# Patient Record
Sex: Female | Born: 1995 | Race: White | Hispanic: No | Marital: Single | Smoking: Never smoker
Health system: Southern US, Community
[De-identification: ages and names within clinical notes are randomized; demographics above are authoritative.]

## PROBLEM LIST (undated history)

## (undated) DIAGNOSIS — M810 Age-related osteoporosis without current pathological fracture: Secondary | ICD-10-CM

## (undated) DIAGNOSIS — F509 Eating disorder, unspecified: Secondary | ICD-10-CM

## (undated) HISTORY — DX: Eating disorder, unspecified: F50.9

## (undated) HISTORY — PX: NASAL SINUS SURGERY: SHX719

## (undated) HISTORY — DX: Age-related osteoporosis without current pathological fracture: M81.0

---

## 2017-05-02 DIAGNOSIS — M810 Age-related osteoporosis without current pathological fracture: Secondary | ICD-10-CM

## 2017-05-02 HISTORY — DX: Age-related osteoporosis without current pathological fracture: M81.0

## 2017-06-17 ENCOUNTER — Other Ambulatory Visit: Payer: Self-pay

## 2017-06-17 ENCOUNTER — Encounter: Payer: Self-pay | Admitting: Physician Assistant

## 2017-06-17 ENCOUNTER — Ambulatory Visit (INDEPENDENT_AMBULATORY_CARE_PROVIDER_SITE_OTHER): Payer: BLUE CROSS/BLUE SHIELD | Admitting: Physician Assistant

## 2017-06-17 VITALS — BP 96/68 | HR 80 | Temp 99.0°F | Resp 18 | Ht 65.0 in | Wt 94.4 lb

## 2017-06-17 DIAGNOSIS — M818 Other osteoporosis without current pathological fracture: Secondary | ICD-10-CM | POA: Diagnosis not present

## 2017-06-17 DIAGNOSIS — Z114 Encounter for screening for human immunodeficiency virus [HIV]: Secondary | ICD-10-CM

## 2017-06-17 DIAGNOSIS — Z113 Encounter for screening for infections with a predominantly sexual mode of transmission: Secondary | ICD-10-CM

## 2017-06-17 DIAGNOSIS — Z7689 Persons encountering health services in other specified circumstances: Secondary | ICD-10-CM | POA: Diagnosis not present

## 2017-06-17 DIAGNOSIS — M810 Age-related osteoporosis without current pathological fracture: Secondary | ICD-10-CM | POA: Insufficient documentation

## 2017-06-17 DIAGNOSIS — Z13220 Encounter for screening for lipoid disorders: Secondary | ICD-10-CM | POA: Diagnosis not present

## 2017-06-17 DIAGNOSIS — F509 Eating disorder, unspecified: Secondary | ICD-10-CM | POA: Diagnosis not present

## 2017-06-17 NOTE — Progress Notes (Signed)
Patient ID: Gail Anderson, female    DOB: [birth year], 22 y.o.   MRN: [id number]  PCP: Gail Anderson, Gail Sartin, PA-C  Chief Complaint  Patient presents with  . Establish Care  . Eating Disorder    Subjective:   Presents to establish care and for evaluation of medical stability for treatment of disordered eating with a nutritionist and therapist.  Initial diagnosis at age 22.  Describes herself as an overweight kid, and lost weight with health yeating changes and exercise, but then lost control. Relapsed 07/2016, worsened at home over the summer break from school. Thinks this was due to going to college, joining a sorority, stressors of school and partying/alcohol consumption leading to weight gain.  Restriction, primarily. Safe foods, restricting timing of eating. "As little as possible to get through the day." No induced emesis, no laxatives. Not currently, but has previously over-exercised. No known family members with disordered eating. No previous medications, never been brought up.  Family is supportive, though emotions are high at home. Her parents are better able to manage her disorder, "the second time around."  No CP or SOB, palpitations recently, has in the past. Sometimes dizziness. Fatigue, but improved. Feels anxious, not depressed.  Started COC the summer before college, in anticipation of sexual activity. Was advised to start calcium supplementation following DEXA revealing osteoporosis 05/2017, but doesn't remember to take it.    Patient Active Problem List   Diagnosis Date Noted  . Eating disorder 06/17/2017  . Osteoporosis 06/17/2017    Past Medical History:  Diagnosis Date  . Eating disorder   . Osteoporosis 05/2017     Prior to Admission medications   Medication Sig Start Date End Date Taking? Authorizing Provider  CRYSELLE-28 0.3-30 MG-MCG tablet  05/27/17   [provider]    Allergies  Allergen Reactions  . Penicillins Rash    Past  Surgical History:  Procedure Laterality Date  . NASAL SINUS SURGERY      Family History  Problem Relation Age of Onset  . Hyperlipidemia Mother   . Heart disease Paternal Grandfather     Social History   Socioeconomic History  . Marital status: Single    Spouse name: None  . Number of children: 0  . Years of education: None  . Highest education level: None  Social Needs  . Financial resource strain: Not hard at all  . Food insecurity - worry: Never true  . Food insecurity - inability: Never true  . Transportation needs - medical: No  . Transportation needs - non-medical: No  Occupational History  . Occupation: Consulting civil engineerstudent    Comment: [employer] ([employer])  Tobacco Use  . Smoking status: Never Smoker  . Smokeless tobacco: Never Used  Substance and Sexual Activity  . Alcohol use: No    Frequency: Never  . Drug use: No  . Sexual activity: Yes    Birth control/protection: Pill  Other Topics Concern  . None  Social History Narrative   From [place] school in [place], [place]   Lives in off-campus apartment with a roommate.       Review of Systems  Depression screen [facility] 2/9 06/17/2017  Decreased Interest 0  Down, Depressed, Hopeless 0  PHQ - 2 Score 0       Objective:  Physical Exam  Constitutional: She is oriented to person, place, and time. She appears well-developed and well-nourished. She is active and cooperative. No distress.  BP 96/68 (BP Location: Left  Arm, Patient Position: Sitting, Cuff Size: Small)   Pulse 80   Temp 99 F (37.2 C) (Oral)   Resp 18   Ht 5\' 5"  (1.651 m)   Wt 94 lb 6.4 oz (42.8 kg)   LMP 05/27/2017   SpO2 98%   BMI 15.71 kg/m   HENT:  Head: Normocephalic and atraumatic.  Right Ear: Hearing normal.  Left Ear: Hearing normal.  Eyes: Conjunctivae are normal. No scleral icterus.  Neck: Normal range of motion. Neck supple. No thyromegaly present.  Cardiovascular: Normal rate, regular rhythm and normal heart  sounds.  Pulses:      Radial pulses are 2+ on the right side, and 2+ on the left side.  Pulmonary/Chest: Effort normal and breath sounds normal.  Abdominal: Soft. Bowel sounds are normal. She exhibits no distension and no mass. There is no tenderness.  Musculoskeletal: She exhibits no edema or deformity.  Lymphadenopathy:       Head (right side): No tonsillar, no preauricular, no posterior auricular and no occipital adenopathy present.       Head (left side): No tonsillar, no preauricular, no posterior auricular and no occipital adenopathy present.    She has no cervical adenopathy.       Right: No supraclavicular adenopathy present.       Left: No supraclavicular adenopathy present.  Neurological: She is alert and oriented to person, place, and time. No sensory deficit.  Skin: Skin is warm, dry and intact. No rash noted. No cyanosis or erythema. Nails show no clubbing.  Psychiatric: She has a normal mood and affect. Her speech is normal and behavior is normal. Judgment and thought content normal. Cognition and memory are normal.       Orthostatic VS for the past 24 hrs:  BP- Lying Pulse- Lying BP- Sitting Pulse- Sitting BP- Standing at 0 minutes Pulse- Standing at 0 minutes  06/17/17 0934 99/66 57 101/68 66 108/74 74    EKG reviewed with Dr. Creta Levin. Sinus bradycardia, rate of 58. PR 156. QT 406. No previous tracing for comparison.    Assessment & Plan:   Problem List Items Addressed This Visit    Eating disorder    Relapse. Continue nutrition counseling, establish with therapist, continue COC. Await lab results. RESUME calcium supplementation.      Relevant Orders   CBC with Differential/Platelet   TSH   Urinalysis, dipstick only   Amylase   EKG 12-Lead (Completed)   Ferritin   Lipase   Magnesium   Phosphorus   T3, free   T4   ToxASSURE Select 13 (MW), Urine   Vitamin B12   Orthostatic vital signs   Hepatic function panel   Basic metabolic panel   Acute Hep  Panel & Hep B Surface Ab   VITAMIN D 25 Hydroxy (Vit-D Deficiency, Fractures)   Osteoporosis    Resume calcium supplement. Repeat DEXA 05/2019.       Other Visit Diagnoses    Encounter to establish care    -  Primary   Screening for HIV (human immunodeficiency virus)       Relevant Orders   HIV antibody   Screening for hyperlipidemia       Relevant Orders   Lipid panel   Routine screening for STI (sexually transmitted infection)       Relevant Orders   GC/Chlamydia Probe Amp       Return in about 3 months (around 09/15/2017) for re-evalaution of disordered eating.   Fernande Bras, PA-C  [facility] at [facility]

## 2017-06-17 NOTE — Patient Instructions (Addendum)
Please resume the calcium supplementation. Put it with your contraceptive pill to help you remember to take it.   IF you received an x-ray today, you will receive an invoice from [facility]. Please contact [facility] at [phone] with questions or concerns regarding your invoice.   IF you received labwork today, you will receive an invoice from [facility]. Please contact [facility] at [phone] with questions or concerns regarding your invoice.   Our billing staff will not be able to assist you with questions regarding bills from these companies.  You will be contacted with the lab results as soon as they are available. The fastest way to get your results is to activate your My Chart account. Instructions are located on the last page of this paperwork. If you have not heard from us regarding the results in 2 weeks, please contact this office.

## 2017-06-17 NOTE — Assessment & Plan Note (Signed)
Relapse. Continue nutrition counseling, establish with therapist, continue COC. Await lab results. RESUME calcium supplementation.

## 2017-06-17 NOTE — Assessment & Plan Note (Signed)
Resume calcium supplement. Repeat DEXA 05/2019.

## 2017-06-18 LAB — CBC WITH DIFFERENTIAL/PLATELET
Basophils Absolute: 0.1 10*3/uL (ref 0.0–0.2)
Basos: 1 %
EOS (ABSOLUTE): 0.3 10*3/uL (ref 0.0–0.4)
EOS: 4 %
HEMATOCRIT: 39.7 % (ref 34.0–46.6)
Hemoglobin: 13.4 g/dL (ref 11.1–15.9)
Immature Grans (Abs): 0 10*3/uL (ref 0.0–0.1)
Immature Granulocytes: 0 %
LYMPHS ABS: 2.4 10*3/uL (ref 0.7–3.1)
Lymphs: 37 %
MCH: 32 pg (ref 26.6–33.0)
MCHC: 33.8 g/dL (ref 31.5–35.7)
MCV: 95 fL (ref 79–97)
MONOS ABS: 0.4 10*3/uL (ref 0.1–0.9)
Monocytes: 6 %
Neutrophils Absolute: 3.3 10*3/uL (ref 1.4–7.0)
Neutrophils: 52 %
PLATELETS: 306 10*3/uL (ref 150–379)
RBC: 4.19 x10E6/uL (ref 3.77–5.28)
RDW: 13.6 % (ref 12.3–15.4)
WBC: 6.4 10*3/uL (ref 3.4–10.8)

## 2017-06-18 LAB — BASIC METABOLIC PANEL
BUN/Creatinine Ratio: 10 (ref 9–23)
BUN: 9 mg/dL (ref 6–20)
CO2: 23 mmol/L (ref 20–29)
CREATININE: 0.86 mg/dL (ref 0.57–1.00)
Calcium: 9.8 mg/dL (ref 8.7–10.2)
Chloride: 100 mmol/L (ref 96–106)
GFR calc Af Amer: 113 mL/min/{1.73_m2} (ref >59)
GFR, EST NON AFRICAN AMERICAN: 98 mL/min/{1.73_m2} (ref >59)
Glucose: 75 mg/dL (ref 65–99)
Potassium: 3.8 mmol/L (ref 3.5–5.2)
SODIUM: 141 mmol/L (ref 134–144)

## 2017-06-18 LAB — VITAMIN D 25 HYDROXY (VIT D DEFICIENCY, FRACTURES): Vit D, 25-Hydroxy: 35.9 ng/mL (ref 30.0–100.0)

## 2017-06-18 LAB — LIPID PANEL
CHOL/HDL RATIO: 3.2 ratio (ref 0.0–4.4)
CHOLESTEROL TOTAL: 205 mg/dL — AB (ref 100–169)
HDL: 65 mg/dL (ref >39)
LDL CALC: 113 mg/dL — AB (ref 0–109)
Triglycerides: 136 mg/dL — ABNORMAL HIGH (ref 0–89)
VLDL Cholesterol Cal: 27 mg/dL (ref 5–40)

## 2017-06-18 LAB — MAGNESIUM: Magnesium: 2.3 mg/dL (ref 1.6–2.3)

## 2017-06-18 LAB — HIV ANTIBODY (ROUTINE TESTING W REFLEX): HIV Screen 4th Generation wRfx: NONREACTIVE

## 2017-06-18 LAB — URINALYSIS, DIPSTICK ONLY
Bilirubin, UA: NEGATIVE
GLUCOSE, UA: NEGATIVE
Ketones, UA: NEGATIVE
Leukocytes, UA: NEGATIVE
Nitrite, UA: NEGATIVE
PROTEIN UA: NEGATIVE
RBC, UA: NEGATIVE
Specific Gravity, UA: 1.009 (ref 1.005–1.030)
Urobilinogen, Ur: 0.2 mg/dL (ref 0.2–1.0)
pH, UA: 6.5 (ref 5.0–7.5)

## 2017-06-18 LAB — LIPASE: Lipase: 33 U/L (ref 14–72)

## 2017-06-18 LAB — ACUTE HEP PANEL AND HEP B SURFACE AB
HEP B S AG: NEGATIVE
Hep A IgM: NEGATIVE
Hep B C IgM: NEGATIVE
Hepatitis B Surf Ab Quant: <3.1 m[IU]/mL — ABNORMAL LOW (ref >9.9)

## 2017-06-18 LAB — HEPATIC FUNCTION PANEL
ALK PHOS: 39 IU/L (ref 39–117)
ALT: 35 IU/L — ABNORMAL HIGH (ref 0–32)
AST: 27 IU/L (ref 0–40)
Albumin: 4.6 g/dL (ref 3.5–5.5)
BILIRUBIN, DIRECT: 0.11 mg/dL (ref 0.00–0.40)
Bilirubin Total: 0.4 mg/dL (ref 0.0–1.2)
TOTAL PROTEIN: 7.4 g/dL (ref 6.0–8.5)

## 2017-06-18 LAB — AMYLASE: AMYLASE: 105 U/L (ref 31–124)

## 2017-06-18 LAB — FERRITIN: FERRITIN: 28 ng/mL (ref 15–77)

## 2017-06-18 LAB — GC/CHLAMYDIA PROBE AMP
CHLAMYDIA, DNA PROBE: NEGATIVE
Neisseria gonorrhoeae by PCR: NEGATIVE

## 2017-06-18 LAB — T3, FREE: T3 FREE: 2.3 pg/mL (ref 2.3–5.0)

## 2017-06-18 LAB — T4: T4, Total: 8.6 ug/dL (ref 4.5–12.0)

## 2017-06-18 LAB — VITAMIN B12: Vitamin B-12: 942 pg/mL (ref 232–1245)

## 2017-06-18 LAB — TSH: TSH: 2.37 u[IU]/mL (ref 0.450–4.500)

## 2017-06-18 LAB — PHOSPHORUS: Phosphorus: 3.6 mg/dL (ref 2.5–5.3)

## 2017-06-23 LAB — TOXASSURE SELECT 13 (MW), URINE

## 2017-06-24 ENCOUNTER — Telehealth: Payer: Self-pay | Admitting: Physician Assistant

## 2017-06-24 NOTE — Telephone Encounter (Signed)
Copied from CRM [phone]. Topic: General - Other >> Jun 24, 2017  9:43 AM [name], [name], RMA wrote: Reason for CRM: Rosezena SensorMegan Haley from [employer] called and would like a call back concerning recent lab work Please contact her at [phone]

## 2017-06-24 NOTE — Telephone Encounter (Signed)
Can you review labs so I can inform patient and get the okay from the patient to release results to Rosezena SensorMegan Haley from [employer].

## 2017-06-24 NOTE — Telephone Encounter (Signed)
Gail not on DPR.   Copied from CRM [phone]. Topic: General - Other >> Jun 24, 2017  9:43 AM [name], [name], RMA wrote: Reason for CRM: [name] from [employer] called and would like a call back concerning recent lab work Please contact her at [phone]

## 2017-06-25 NOTE — Telephone Encounter (Signed)
Results released to the patient in my chart.  OK to release to [facility].

## 2017-07-31 ENCOUNTER — Ambulatory Visit: Payer: Self-pay | Admitting: Physician Assistant

## 2017-08-01 ENCOUNTER — Other Ambulatory Visit: Payer: Self-pay

## 2017-08-01 ENCOUNTER — Ambulatory Visit: Payer: BLUE CROSS/BLUE SHIELD | Admitting: Physician Assistant

## 2017-08-01 ENCOUNTER — Encounter: Payer: Self-pay | Admitting: Physician Assistant

## 2017-08-01 VITALS — BP 101/68 | HR 70 | Temp 98.0°F | Ht 65.0 in | Wt 94.8 lb

## 2017-08-01 DIAGNOSIS — Z113 Encounter for screening for infections with a predominantly sexual mode of transmission: Secondary | ICD-10-CM | POA: Diagnosis not present

## 2017-08-01 DIAGNOSIS — F419 Anxiety disorder, unspecified: Secondary | ICD-10-CM

## 2017-08-01 DIAGNOSIS — F509 Eating disorder, unspecified: Secondary | ICD-10-CM

## 2017-08-01 DIAGNOSIS — F411 Generalized anxiety disorder: Secondary | ICD-10-CM | POA: Insufficient documentation

## 2017-08-01 LAB — POCT CBC
GRANULOCYTE PERCENT: 64 % (ref 37–80)
HEMATOCRIT: 41.4 % (ref 37.7–47.9)
Hemoglobin: 13.9 g/dL (ref 12.2–16.2)
LYMPH, POC: 2.4 (ref 0.6–3.4)
MCH, POC: 28.2 pg (ref 27–31.2)
MCHC: 33.6 g/dL (ref 31.8–35.4)
MCV: 83.8 fL (ref 80–97)
MID (CBC): 0.3 (ref 0–0.9)
MPV: 7 fL (ref 0–99.8)
PLATELET COUNT, POC: 329 10*3/uL (ref 142–424)
POC Granulocyte: 4.7 (ref 2–6.9)
POC LYMPH %: 32.5 % (ref 10–50)
POC MID %: 3.5 %M (ref 0–12)
RBC: 4.94 M/uL (ref 4.04–5.48)
RDW, POC: 12.7 %
WBC: 7.4 10*3/uL (ref 4.6–10.2)

## 2017-08-01 LAB — POCT URINE PREGNANCY: Preg Test, Ur: NEGATIVE

## 2017-08-01 MED ORDER — SERTRALINE HCL 50 MG PO TABS: 50.0000 mg | ORAL_TABLET | Freq: Every day | ORAL | 3 refills | Status: DC

## 2017-08-01 NOTE — Assessment & Plan Note (Signed)
Start sertraline 50 mg.  1/2 tablet daily for 1 week then increase to 1 tablet.  Counseled regarding potential side effects.  Anticipatory guidance provided.

## 2017-08-01 NOTE — Patient Instructions (Addendum)
When starting the sertraline (Zoloft), take 1/2 tablet each day for the first week, then increase to the whole tablet.    IF you received an x-ray today, you will receive an invoice from [facility]. Please contact [facility] at [phone] with questions or concerns regarding your invoice.   IF you received labwork today, you will receive an invoice from [facility]. Please contact [facility] at [phone] with questions or concerns regarding your invoice.   Our billing staff will not be able to assist you with questions regarding bills from these companies.  You will be contacted with the lab results as soon as they are available. The fastest way to get your results is to activate your My Chart account. Instructions are located on the last page of this paperwork. If you have not heard from us regarding the results in 2 weeks, please contact this office.

## 2017-08-01 NOTE — Progress Notes (Signed)
Patient ID: Gail Anderson, female    DOB: 1996-02-10, 22 y.o.   MRN: [id number]  PCP: Porfirio Oar, PA-C  Chief Complaint  Patient presents with  . Follow-up    follow up on eating disorder and Dietician is requesting labwork    Subjective:   Presents for evaluation of Disordered eating.  Her dietician advised her to come in to update labs, EKG and orthostatics, wanting to have more frequent medical visits due to active eating disorder.  Both her dietitian and her therapist recommend that she start medication for anxiety. No previous medical treatment for mood disorder. She denies induced emesis, laxative use, over exercise. She denies chest pain, SOB, dizziness.   Review of Systems  Constitutional: Positive for fatigue. Negative for activity change, appetite change and unexpected weight change.  HENT: Negative for sore throat.   Eyes: Negative for visual disturbance.  Respiratory: Negative for cough, chest tightness, shortness of breath and wheezing.   Cardiovascular: Negative for chest pain and palpitations.  Gastrointestinal: Positive for constipation. Negative for abdominal pain, diarrhea, nausea and vomiting.  Genitourinary: Negative for dysuria, frequency, hematuria and urgency.  Musculoskeletal: Negative for arthralgias and myalgias.  Skin: Negative for rash.  Neurological: Negative for dizziness, weakness and headaches.  Psychiatric/Behavioral: Negative for decreased concentration. The patient is nervous/anxious.      Depression screen [facility] 2/9 08/01/2017 06/17/2017  Decreased Interest 0 0  Down, Depressed, Hopeless 0 0  PHQ - 2 Score 0 0    GAD 7 : Generalized Anxiety Score 08/01/2017  Nervous, Anxious, on Edge 2  Control/stop worrying 2  Worry too much - different things 3  Trouble relaxing 3  Restless 2  Easily annoyed or irritable 2  Afraid - awful might happen 1  Total GAD 7 Score 15  Anxiety Difficulty Very difficult      Patient Active Problem  List   Diagnosis Date Noted  . Eating disorder 06/17/2017  . Osteoporosis 06/17/2017     Prior to Admission medications   Medication Sig Start Date End Date Taking? Authorizing Provider  CRYSELLE-28 0.3-30 MG-MCG tablet  05/27/17   [provider]     Allergies  Allergen Reactions  . Penicillins Rash       Objective:  Physical Exam  Constitutional: She is oriented to person, place, and time. She appears well-developed. She appears cachectic. She is active and cooperative. No distress.  BP 101/68 (BP Location: Left Arm, Patient Position: Sitting, Cuff Size: Normal)   Pulse 70   Temp 98 F (36.7 C) (Oral)   Ht 5\' 5"  (1.651 m)   Wt 94 lb 12.8 oz (43 kg)   SpO2 97%   BMI 15.78 kg/m   HENT:  Head: Normocephalic and atraumatic.  Right Ear: Hearing normal.  Left Ear: Hearing normal.  Eyes: Conjunctivae are normal. No scleral icterus.  Neck: Normal range of motion. Neck supple. No thyromegaly present.  Cardiovascular: Normal rate, regular rhythm and normal heart sounds.  Pulses:      Radial pulses are 2+ on the right side, and 2+ on the left side.  Pulmonary/Chest: Effort normal and breath sounds normal.  Lymphadenopathy:       Head (right side): No tonsillar, no preauricular, no posterior auricular and no occipital adenopathy present.       Head (left side): No tonsillar, no preauricular, no posterior auricular and no occipital adenopathy present.    She has no cervical adenopathy.       Right: No  supraclavicular adenopathy present.       Left: No supraclavicular adenopathy present.  Neurological: She is alert and oriented to person, place, and time. No sensory deficit.  Skin: Skin is warm, dry and intact. No rash noted. No cyanosis or erythema. Nails show no clubbing.  Psychiatric: She has a normal mood and affect. Her speech is normal and behavior is normal.    Wt Readings from Last 3 Encounters:  08/01/17 94 lb 12.8 oz (43 kg) (1 %, Z= -2.31)*  06/17/17 94  lb 6.4 oz (42.8 kg) (<1 %, Z= -2.34)*   * Growth percentiles are based on CDC (Girls, 2-20 Years) data.   EKG reviewed with Dr. Clelia CroftShaw. NSR. Compared to 06/2017, unchanged other than normal rate (previous bradycardia).    Assessment & Plan:   Problem List Items Addressed This Visit    Eating disorder - Primary    Currently active.  Continue with psychotherapy and nutrition counseling.  Weight is stable since her visit with me in January.  Update labs today.  EKG is normal-reassuring.      Relevant Orders   EKG 12-Lead (Completed)   Amylase   Basic metabolic panel   Ferritin   Hepatic function panel   Lipase   Magnesium   Orthostatic vital signs   Phosphorus   POCT CBC (Completed)   POCT urine pregnancy (Completed)   T3, free   T4   ToxASSURE Select 13 (MW), Urine   TSH   Urinalysis, dipstick only   Vitamin B12   Anxiety disorder    Start sertraline 50 mg.  1/2 tablet daily for 1 week then increase to 1 tablet.  Counseled regarding potential side effects.  Anticipatory guidance provided.      Relevant Medications   sertraline (ZOLOFT) 50 MG tablet    Other Visit Diagnoses    Routine screening for STI (sexually transmitted infection)       Relevant Orders   GC/Chlamydia Probe Amp       Return in about 4 weeks (around 08/29/2017) for re-evaluation of anxiety .   Fernande Brashelle S. Danielle Mink, PA-C Primary [facility] at [facility]

## 2017-08-01 NOTE — Assessment & Plan Note (Signed)
Currently active.  Continue with psychotherapy and nutrition counseling.  Weight is stable since her visit with me in January.  Update labs today.  EKG is normal-reassuring.

## 2017-08-02 LAB — BASIC METABOLIC PANEL
BUN/Creatinine Ratio: 11 (ref 9–23)
BUN: 10 mg/dL (ref 6–20)
CALCIUM: 9.7 mg/dL (ref 8.7–10.2)
CHLORIDE: 99 mmol/L (ref 96–106)
CO2: 25 mmol/L (ref 20–29)
Creatinine, Ser: 0.94 mg/dL (ref 0.57–1.00)
GFR calc Af Amer: 102 mL/min/{1.73_m2} (ref >59)
GFR calc non Af Amer: 88 mL/min/{1.73_m2} (ref >59)
Glucose: 86 mg/dL (ref 65–99)
POTASSIUM: 4 mmol/L (ref 3.5–5.2)
Sodium: 139 mmol/L (ref 134–144)

## 2017-08-02 LAB — HEPATIC FUNCTION PANEL
ALBUMIN: 4.4 g/dL (ref 3.5–5.5)
ALT: 31 IU/L (ref 0–32)
AST: 29 IU/L (ref 0–40)
Alkaline Phosphatase: 49 IU/L (ref 39–117)
Bilirubin Total: 0.5 mg/dL (ref 0.0–1.2)
Bilirubin, Direct: 0.12 mg/dL (ref 0.00–0.40)
TOTAL PROTEIN: 7.3 g/dL (ref 6.0–8.5)

## 2017-08-02 LAB — VITAMIN B12: VITAMIN B 12: 1142 pg/mL (ref 232–1245)

## 2017-08-02 LAB — AMYLASE: AMYLASE: 103 U/L (ref 31–124)

## 2017-08-02 LAB — T4: T4 TOTAL: 8 ug/dL (ref 4.5–12.0)

## 2017-08-02 LAB — T3, FREE: T3 FREE: 2.3 pg/mL (ref 2.3–5.0)

## 2017-08-02 LAB — LIPASE: Lipase: 31 U/L (ref 14–72)

## 2017-08-02 LAB — MAGNESIUM: MAGNESIUM: 2.3 mg/dL (ref 1.6–2.3)

## 2017-08-02 LAB — PHOSPHORUS: PHOSPHORUS: 3.6 mg/dL (ref 2.5–5.3)

## 2017-08-02 LAB — TSH: TSH: 2.05 u[IU]/mL (ref 0.450–4.500)

## 2017-08-02 LAB — FERRITIN: Ferritin: 25 ng/mL (ref 15–77)

## 2017-08-04 LAB — GC/CHLAMYDIA PROBE AMP
CHLAMYDIA, DNA PROBE: NEGATIVE
Neisseria gonorrhoeae by PCR: NEGATIVE

## 2017-08-05 LAB — URINALYSIS, DIPSTICK ONLY
BILIRUBIN UA: NEGATIVE
GLUCOSE, UA: NEGATIVE
KETONES UA: NEGATIVE
Leukocytes, UA: NEGATIVE
NITRITE UA: NEGATIVE
Protein, UA: NEGATIVE
RBC, UA: NEGATIVE
Specific Gravity, UA: <=1.005 — AB (ref 1.005–1.030)
UUROB: 0.2 mg/dL (ref 0.2–1.0)
pH, UA: 7.5 (ref 5.0–7.5)

## 2017-08-05 LAB — TOXASSURE SELECT 13 (MW), URINE

## 2017-08-18 ENCOUNTER — Encounter: Payer: Self-pay | Admitting: Pediatrics

## 2017-08-18 ENCOUNTER — Ambulatory Visit (INDEPENDENT_AMBULATORY_CARE_PROVIDER_SITE_OTHER): Payer: BLUE CROSS/BLUE SHIELD | Admitting: Pediatrics

## 2017-08-18 VITALS — BP 111/71 | HR 90 | Ht 64.57 in | Wt 95.4 lb

## 2017-08-18 DIAGNOSIS — K5900 Constipation, unspecified: Secondary | ICD-10-CM | POA: Diagnosis not present

## 2017-08-18 DIAGNOSIS — Z3202 Encounter for pregnancy test, result negative: Secondary | ICD-10-CM

## 2017-08-18 DIAGNOSIS — M818 Other osteoporosis without current pathological fracture: Secondary | ICD-10-CM | POA: Diagnosis not present

## 2017-08-18 DIAGNOSIS — Z1389 Encounter for screening for other disorder: Secondary | ICD-10-CM

## 2017-08-18 DIAGNOSIS — R630 Anorexia: Secondary | ICD-10-CM

## 2017-08-18 DIAGNOSIS — F419 Anxiety disorder, unspecified: Secondary | ICD-10-CM

## 2017-08-18 DIAGNOSIS — Z113 Encounter for screening for infections with a predominantly sexual mode of transmission: Secondary | ICD-10-CM

## 2017-08-18 LAB — POCT URINALYSIS DIPSTICK
BILIRUBIN UA: NEGATIVE
GLUCOSE UA: NEGATIVE
KETONES UA: NEGATIVE
Leukocytes, UA: NEGATIVE
Nitrite, UA: NEGATIVE
PH UA: 7 (ref 5.0–8.0)
Protein, UA: NEGATIVE
RBC UA: NEGATIVE
Spec Grav, UA: 1.01 (ref 1.010–1.025)
UROBILINOGEN UA: NEGATIVE U/dL — AB

## 2017-08-18 LAB — POCT URINE PREGNANCY: Preg Test, Ur: NEGATIVE

## 2017-08-18 MED ORDER — POLYETHYLENE GLYCOL 3350 17 GM/SCOOP PO POWD: 17.0000 g | Freq: Every day | ORAL | 6 refills | Status: DC

## 2017-08-18 NOTE — Progress Notes (Signed)
THIS RECORD MAY CONTAIN CONFIDENTIAL INFORMATION THAT SHOULD NOT BE RELEASED WITHOUT REVIEW OF THE SERVICE PROVIDER.  Adolescent Medicine Consultation Initial Visit Gail HeinrichMaria Anderson  is a 22 y.o. female referred by self here today for evaluation of disordered eating, underweight, concern from dietitian.      Review of records?  No records to review currently  Pertinent Labs? Yes, labs normal at visit 1 week ago; EKG   Growth Chart Viewed? yes   History was provided by the patient.  PCP Confirmed?  yes    Chief Complaint  Patient presents with  . New Patient (Initial Visit)    HPI:    Dx with anorexia in 2015. Outpatient tx for about 2 years. Was in remisison upon entering college. Relapse about a year ago- lost most weight last summer and into fall. Parents became concerned and came to tx in November. [place] one dietitian in [place] and then to [place]. [place] [facility]. Was seeing an NP every other week. [facility].   [facility] [place] [facility]. Feels like she has made some progress- was told in Dec she would have to go inpatient which was scary so she got motivated to improve. Lowest weight around Thanksgiving 84 pounds. Saw [place] [facility].   Got started on zoloft a few weeks ago. Feels like it is helping. Was having anxiety attacks, guilt about eating. Hasn't had an anxiety attack since. 50 mg. Still having some difficulty staying asleep. Wakes up about 1-2 am.   Double major in statistics and [employer] analytics. 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 Brother and two parents [place].   Having significant digestion problems. Dizziness, back pain, joint pain and fatigue have improved. Was dx with osteoporosis- had a bone density scan.   Been on OCPs x 1 year. Was without a period for about a year previously- had it back for about 6 months prior to OCP. No concerns or issues. Remembers to take it  every day.   Stool about every 3 days. She usually just tries to get through it but is not using any medication.   On a meal plan. Not doing exchanges. 3 ensure shakes a day + 3 meals and 3 snacks.  24 hour recall:  B: oatmeal with berries and PB S: ensure, bar, bag of pretzels  L: 2 rice cakes with cottage cheese, cherry tomatoes, crackers, almonds, carrots and hummus  S: ensure and square organics protein bar  D: noodle bowl with veggies, tofu and peanut sauce  S: ensure, yogurt with blueberries, english muffin with PB  Was vegetarian- not now but doesn't eat meat as often. Doesn't like red meat. Regular ensures.   Patient's last menstrual period was 07/21/2017.  Review of Systems  Constitutional: Negative for fatigue.  Respiratory: Negative for shortness of breath.   Cardiovascular: Negative for chest pain and palpitations.  Gastrointestinal: Positive for abdominal distention and constipation. Negative for abdominal pain, nausea and vomiting.  Genitourinary: Negative for dysuria and vaginal discharge.  Musculoskeletal: Negative for myalgias.  Neurological: Negative for dizziness and headaches.  Hematological: Does not bruise/bleed easily.    Allergies  Allergen Reactions  . Penicillins Rash   Outpatient Medications Prior to Visit  Medication Sig Dispense Refill  . CRYSELLE-28 0.3-30 MG-MCG tablet   0  . sertraline (ZOLOFT) 50 MG tablet Take 1 tablet (50 mg total) by mouth daily. 30 tablet 3   No facility-administered medications  prior to visit.      Patient Active Problem List   Diagnosis Date Noted  . Anxiety disorder 08/01/2017  . Eating disorder 06/17/2017  . Osteoporosis 06/17/2017    Past Medical History:  Reviewed and updated?  yes Past Medical History:  Diagnosis Date  . Eating disorder   . Osteoporosis 05/2017    Family History: Reviewed and updated? yes Family History  Problem Relation Age of Onset  . Hyperlipidemia Mother   . Heart disease  Paternal Grandfather     Social History:  School:  School: In Diplomatic Services operational officer at [employer] at school:  no Future Plans:  college  Activities:  Special interests/hobbies/sports: not exercising. Walks around campus. Would like to be able to exercise again. Picked up running and enjoyed it but then used it as a tool to lose weight.   Lifestyle habits that can impact QOL: Sleep: some difficulty falling and staying asleep Eating habits/patterns: as above Water intake: fair Exercise: none currently   Confidentiality was discussed with the patient and if applicable, with caregiver as well.  Gender identity: female  Sex assigned at birth: female  Pronouns: she Tobacco?  no Drugs/ETOH?  no Partner preference?  female  Sexually Active?  yes, never uses condoms   Pregnancy Prevention:  birth control pills Reviewed condoms:  yes Reviewed EC:  yes   History or current traumatic events (natural disaster, house fire, etc.)? no History or current physical trauma?  no History or current emotional trauma?  no History or current sexual trauma?  no History or current domestic or intimate partner violence?  no History of bullying:  yes, in elementary school for being overweight  Trusted adult at home/school:  yes Feels safe at home:  yes Trusted friends:  yes Feels safe at school:  yes  Suicidal or homicidal thoughts?   no Self injurious behaviors?  no Guns in the home?  no   The following portions of the patient's history were reviewed and updated as appropriate: allergies, current medications, past family history, past medical history, past social history, past surgical history and problem list.  Physical Exam:  Vitals:   08/18/17 1406 08/18/17 1420  BP: 100/67 111/71  Pulse: 75 90  Weight: 95 lb 6.4 oz (43.3 kg)   Height: 5' 4.57" (1.64 m)    BP 111/71   Pulse 90   Ht 5' 4.57" (1.64 m)   Wt 95 lb 6.4 oz (43.3 kg)   LMP 07/21/2017   BMI 16.09 kg/m   Body mass index: body mass index is 16.09 kg/m. Blood pressure percentiles are 40 % systolic and 72 % diastolic based on the August 2017 AAP Clinical Practice Guideline. Blood pressure percentile targets: 90: 127/78, 95: 129/81, 95 + 12 mmHg: 141/93.   Physical Exam  Constitutional: She appears well-developed. No distress.  HENT:  Mouth/Throat: Oropharynx is clear and moist.  Neck: No thyromegaly present.  Cardiovascular: Normal rate and regular rhythm.  No murmur heard. Pulmonary/Chest: Breath sounds normal.  Abdominal: Soft. She exhibits no mass. There is no tenderness. There is no guarding.  Musculoskeletal: She exhibits no edema.  Lymphadenopathy:    She has no cervical adenopathy.  Neurological: She is alert.  Skin: Skin is warm. No rash noted.  Psychiatric: She has a normal mood and affect.  Nursing note and vitals reviewed.    Assessment/Plan: 1. Anorexia In a relapse from AN. Has a treatment team established- Luther Parody and a therapist in [place]. Was recommended  to a HLC around December but has had some good weight gain since then. Reports she is more invested in recovery now. Vitals are stable. Labs 2 weeks ago were stable so will not repeat today.   2. Anxiety disorder, unspecified type Continue zoloft 50 mg for now and therapy. Can consider increase at next visit if needed.   3. Constipation, unspecified constipation type Start miralax 1 capful daily. Increase water and fiber.   4. Pregnancy examination or test, negative result Per protocol. Neg.  - POCT urine pregnancy  5. Routine screening for STI (sexually transmitted infection) Per protocol. Will get HIV on future labs.  - C. trachomatis/N. gonorrhoeae RNA  6. Screening for genitourinary condition Normal.  - POCT urinalysis dipstick  7. Other osteoporosis without current pathological fracture Reports osteoporosis when she was being treated with [facility] on bone density. Got ROI to obtain that testing.  Recommended calcium 1200 mg daily and vit d 1000 IU daily to help support bone health.     BH screenings: PHQSADs and EAT26 reviewed and indicated ongoing anxiety sx, elevated EAT26 consistent with ED relapse. Screens discussed with patient and parent and adjustments to plan made accordingly.    Follow-up:   3 weeks   Medical decision-making:  >30 minutes spent face to face with patient with more than 50% of appointment spent discussing diagnosis, management, follow-up, and reviewing of anxiety, anorexia, constipation, osteoporosis.  CC: Porfirio Oar, PA-C, Porfirio Oar, PA-C

## 2017-08-18 NOTE — Patient Instructions (Signed)
Vitamin D 1000 IU daily  Calcium 600 mg twice a day

## 2017-08-19 DIAGNOSIS — K5900 Constipation, unspecified: Secondary | ICD-10-CM | POA: Insufficient documentation

## 2017-08-20 LAB — C. TRACHOMATIS/N. GONORRHOEAE RNA
C. TRACHOMATIS RNA, TMA: NOT DETECTED
N. GONORRHOEAE RNA, TMA: NOT DETECTED

## 2017-09-03 ENCOUNTER — Encounter: Payer: Self-pay | Admitting: Physician Assistant

## 2017-09-09 ENCOUNTER — Ambulatory Visit (INDEPENDENT_AMBULATORY_CARE_PROVIDER_SITE_OTHER): Payer: BLUE CROSS/BLUE SHIELD | Admitting: Pediatrics

## 2017-09-09 ENCOUNTER — Encounter (INDEPENDENT_AMBULATORY_CARE_PROVIDER_SITE_OTHER): Payer: Self-pay

## 2017-09-09 ENCOUNTER — Encounter: Payer: Self-pay | Admitting: Pediatrics

## 2017-09-09 VITALS — BP 111/67 | HR 83 | Ht 64.37 in | Wt 95.0 lb

## 2017-09-09 DIAGNOSIS — Z1389 Encounter for screening for other disorder: Secondary | ICD-10-CM

## 2017-09-09 DIAGNOSIS — M818 Other osteoporosis without current pathological fracture: Secondary | ICD-10-CM | POA: Diagnosis not present

## 2017-09-09 DIAGNOSIS — F5001 Anorexia nervosa, restricting type: Secondary | ICD-10-CM

## 2017-09-09 DIAGNOSIS — K5900 Constipation, unspecified: Secondary | ICD-10-CM

## 2017-09-09 DIAGNOSIS — F411 Generalized anxiety disorder: Secondary | ICD-10-CM

## 2017-09-09 LAB — POCT URINALYSIS DIPSTICK
Bilirubin, UA: NEGATIVE
Glucose, UA: NEGATIVE
KETONES UA: NEGATIVE
Leukocytes, UA: NEGATIVE
NITRITE UA: NEGATIVE
PROTEIN UA: NEGATIVE
RBC UA: NEGATIVE
Spec Grav, UA: 1.015 (ref 1.010–1.025)
UROBILINOGEN UA: NEGATIVE U/dL — AB
pH, UA: 5 (ref 5.0–8.0)

## 2017-09-09 NOTE — Progress Notes (Signed)
THIS RECORD MAY CONTAIN CONFIDENTIAL INFORMATION THAT SHOULD NOT BE RELEASED WITHOUT REVIEW OF THE SERVICE PROVIDER.  Adolescent Medicine Consultation Follow-Up Visit Gail Anderson  is a 22 y.o. female referred by Porfirio Oar, PA-C here today for follow-up regarding disordered eating, underweight, anxiiety, depression.    Last seen in [facility] on 08/18/17 for the above.  Plan at last visit included continue zoloft 50 mg.  Pertinent Labs? No Growth Chart Viewed? yes   History was provided by the patient.  Interpreter? no  PCP Confirmed?  yes  My Chart Activated?   no   Chief Complaint  Patient presents with  . Follow-up  . Eating Disorder    HPI:    24 hour recall:  B: 2 frozen waffles, almond butter, syrup, berries, almonds, ensure, juice + miralax  S: luna bar, pretzels and teddy grahams  L: english muffin, PB, cream cheese, orange, granola bar  S: yogurt, rice cakes, PB, almonds, ensure  D: rice, chicken and side salad with dressing S: granola, almond milk, ensure   Walking a lot around campus, no exercise.  Will establish with outpatient people in [place] this summer. Does not feel that she needs to go to a higher level of care right now.   Was 113 when she came to college. Highest weight was 140 in high school.   Constipation with miralax is a little better. Still occasionally not good.   Admits to missing exchanges some days because she waits until she gets home late at night and then her DE tells her she doesn't need to eat that late.   Review of Systems  Constitutional: Negative for malaise/fatigue.  Eyes: Negative for double vision.  Respiratory: Negative for shortness of breath.   Cardiovascular: Negative for chest pain and palpitations.  Gastrointestinal: Positive for constipation. Negative for abdominal pain, diarrhea, nausea and vomiting.  Genitourinary: Negative for dysuria.  Musculoskeletal: Negative for joint pain and myalgias.  Skin:  Negative for rash.  Neurological: Negative for dizziness and headaches.  Endo/Heme/Allergies: Does not bruise/bleed easily.     No LMP recorded. Allergies  Allergen Reactions  . Penicillins Rash   Outpatient Medications Prior to Visit  Medication Sig Dispense Refill  . CRYSELLE-28 0.3-30 MG-MCG tablet   0  . lactobacillus acidophilus (BACID) TABS tablet Take 2 tablets by mouth 3 (three) times daily.    . Multiple Vitamin (MULTIVITAMIN) tablet Take 1 tablet by mouth daily.    . polyethylene glycol powder (GLYCOLAX/MIRALAX) powder Take 17 g by mouth daily. 578 g 6  . sertraline (ZOLOFT) 50 MG tablet Take 1 tablet (50 mg total) by mouth daily. 30 tablet 3   No facility-administered medications prior to visit.      Patient Active Problem List   Diagnosis Date Noted  . Constipation 08/19/2017  . Anxiety disorder 08/01/2017  . Eating disorder 06/17/2017  . Osteoporosis 06/17/2017    Suicidal or homicidal thoughts?   no Self injurious behaviors?  no Guns in the home?  no    The following portions of the patient's history were reviewed and updated as appropriate: allergies, current medications, past family history, past medical history, past social history, past surgical history and problem list.  Physical Exam:  Vitals:   09/09/17 1519  BP: 111/67  Pulse: 83  Weight: 95 lb (43.1 kg)  Height: 5' 4.37" (1.635 m)   BP 111/67   Pulse 83   Ht 5' 4.37" (1.635 m)   Wt 95 lb (43.1 kg)  BMI 16.12 kg/m  Body mass index: body mass index is 16.12 kg/m. Blood pressure percentiles are not available for patients who are 18 years or older.   Physical Exam  Constitutional: She is oriented to person, place, and time. She appears well-developed and well-nourished.  HENT:  Head: Normocephalic.  Neck: No thyromegaly present.  Cardiovascular: Normal rate, regular rhythm, normal heart sounds and intact distal pulses.  Pulmonary/Chest: Effort normal and breath sounds normal.   Abdominal: Soft. Bowel sounds are normal. There is no tenderness.  Musculoskeletal: Normal range of motion.  Neurological: She is alert and oriented to person, place, and time.  Skin: Skin is warm and dry.  Psychiatric: She has a normal mood and affect.    Assessment/Plan: 1. Anorexia nervosa, restricting type Has not had any weight gain this interval. We discussed this and the absolute ipmortance of getting in all her exchanges. Communicated this with her dieitian.   2. Other osteoporosis without current pathological fracture Should have repeat bone density next year.   3. Constipation, unspecified constipation type Continue miralax- increase to 2 capfuls if needed.   4. GAD (generalized anxiety disorder) conitnue zoloft 50 mg- doing well.   5. Screening for genitourinary condition No concerns.  - POCT urinalysis dipstick   Follow-up:  4 weeks prior to leaving   Medical decision-making:  >25 minutes spent face to face with patient with more than 50% of appointment spent discussing diagnosis, management, follow-up, and reviewing of anxiety, depression, anorexia.

## 2017-09-15 ENCOUNTER — Ambulatory Visit: Payer: BLUE CROSS/BLUE SHIELD | Admitting: Physician Assistant

## 2017-10-07 ENCOUNTER — Encounter: Payer: Self-pay | Admitting: Pediatrics

## 2017-10-07 ENCOUNTER — Ambulatory Visit (INDEPENDENT_AMBULATORY_CARE_PROVIDER_SITE_OTHER): Payer: BLUE CROSS/BLUE SHIELD | Admitting: Pediatrics

## 2017-10-07 VITALS — BP 109/67 | HR 73 | Ht 64.17 in | Wt 95.0 lb

## 2017-10-07 DIAGNOSIS — K5900 Constipation, unspecified: Secondary | ICD-10-CM

## 2017-10-07 DIAGNOSIS — Z1389 Encounter for screening for other disorder: Secondary | ICD-10-CM | POA: Diagnosis not present

## 2017-10-07 DIAGNOSIS — F411 Generalized anxiety disorder: Secondary | ICD-10-CM | POA: Diagnosis not present

## 2017-10-07 DIAGNOSIS — F5001 Anorexia nervosa, restricting type: Secondary | ICD-10-CM

## 2017-10-07 DIAGNOSIS — F419 Anxiety disorder, unspecified: Secondary | ICD-10-CM | POA: Diagnosis not present

## 2017-10-07 DIAGNOSIS — M818 Other osteoporosis without current pathological fracture: Secondary | ICD-10-CM

## 2017-10-07 LAB — POCT URINALYSIS DIPSTICK
Bilirubin, UA: NEGATIVE
Blood, UA: NEGATIVE
Glucose, UA: NEGATIVE
Ketones, UA: NEGATIVE
LEUKOCYTES UA: NEGATIVE
NITRITE UA: NEGATIVE
PH UA: 5 (ref 5.0–8.0)
Protein, UA: NEGATIVE
SPEC GRAV UA: 1.01 (ref 1.010–1.025)
UROBILINOGEN UA: NEGATIVE U/dL — AB

## 2017-10-07 MED ORDER — SERTRALINE HCL 50 MG PO TABS: 50.0000 mg | ORAL_TABLET | Freq: Every day | ORAL | 1 refills | Status: DC

## 2017-10-07 NOTE — Patient Instructions (Signed)
Increase miralax to daily  ?

## 2017-10-07 NOTE — Progress Notes (Signed)
THIS RECORD MAY CONTAIN CONFIDENTIAL INFORMATION THAT SHOULD NOT BE RELEASED WITHOUT REVIEW OF THE SERVICE PROVIDER.  Adolescent Medicine Consultation Follow-Up Visit Gail Anderson  is a 22 y.o. female referred by Porfirio Oar, PA-C here today for follow-up regarding GAD, anorexia, constipation.    Last seen in [facility]olescent Medicine Clinic on 09/09/17 for the above.  Plan at last visit included continue with local providers, work harder to get exchanges in.  Pertinent Labs? No Growth Chart Viewed? yes   History was provided by the patient.  Interpreter? no  PCP Confirmed?  yes  My Chart Activated?   yes   Chief Complaint  Patient presents with  . Follow-up    HPI:    Having some digestive problems over the last week. Doing miralax which helps but having some problems still. Only doing it ever few days With the stress of finals it is affected her eating. Hasn't been hungry. Having a lot of bloating, constipation, gas.  Done with school on May 20th.  Getting in at least 3 ensures.  Just walking still.   Feels like anxiety with the zoloft is getting a little higher but she attributes that to school. Plan is to just focus on ED recovery over the summer when she is home in [place]. She is agreeable to messaging me when she is home if anxiety is not improved and we will increase zoloft to 100 mg daily.   24 hour recall  Oatmeal, berries, PB, ensure, juice  luna bar, teddy grahams, pretzels  Bagel, cream cheese, orange  Ensure, apple, almonds  Pasta with marinara, chicken, parm cheese and bread  Cereal with nuts and ensure  Felt like that was a typical day of eating for her but felt full due to constipation.    Review of Systems  Constitutional: Negative for malaise/fatigue.  Eyes: Negative for double vision.  Respiratory: Negative for shortness of breath.   Cardiovascular: Negative for chest pain and palpitations.  Gastrointestinal: Positive for abdominal pain and constipation.  Negative for diarrhea, nausea and vomiting.  Genitourinary: Negative for dysuria.  Musculoskeletal: Negative for joint pain and myalgias.  Skin: Negative for rash.  Neurological: Negative for dizziness and headaches.  Endo/Heme/Allergies: Does not bruise/bleed easily.  Psychiatric/Behavioral: The patient is nervous/anxious.      Patient's last menstrual period was 09/09/2017 (exact date). Allergies  Allergen Reactions  . Penicillins Rash   Outpatient Medications Prior to Visit  Medication Sig Dispense Refill  . CRYSELLE-28 0.3-30 MG-MCG tablet   0  . lactobacillus acidophilus (BACID) TABS tablet Take 2 tablets by mouth 3 (three) times daily.    . Multiple Vitamin (MULTIVITAMIN) tablet Take 1 tablet by mouth daily.    . polyethylene glycol powder (GLYCOLAX/MIRALAX) powder Take 17 g by mouth daily. 578 g 6  . sertraline (ZOLOFT) 50 MG tablet Take 1 tablet (50 mg total) by mouth daily. 30 tablet 3   No facility-administered medications prior to visit.      Patient Active Problem List   Diagnosis Date Noted  . Constipation 08/19/2017  . GAD (generalized anxiety disorder) 08/01/2017  . Eating disorder 06/17/2017  . Osteoporosis 06/17/2017     Suicidal or homicidal thoughts?   no Self injurious behaviors?  no Guns in the home?  no    The following portions of the patient's history were reviewed and updated as appropriate: allergies, current medications, past family history, past medical history, past social history, past surgical history and problem list.  Physical Exam:  Vitals:  10/07/17 1458  BP: 109/67  Pulse: 73  Weight: 95 lb (43.1 kg)  Height: 5' 4.17" (1.63 m)   BP 109/67   Pulse 73   Ht 5' 4.17" (1.63 m)   Wt 95 lb (43.1 kg)   LMP 09/09/2017 (Exact Date)   BMI 16.22 kg/m  Body mass index: body mass index is 16.22 kg/m. Blood pressure percentiles are not available for patients who are 18 years or older.   Physical Exam  Constitutional: She appears  well-developed. No distress.  Very thin  HENT:  Mouth/Throat: Oropharynx is clear and moist.  Neck: No thyromegaly present.  Cardiovascular: Normal rate and regular rhythm.  No murmur heard. Pulmonary/Chest: Breath sounds normal.  Abdominal: Soft. She exhibits no mass. There is no tenderness. There is no guarding.  Musculoskeletal: She exhibits no edema.  Lymphadenopathy:    She has no cervical adenopathy.  Neurological: She is alert.  Skin: Skin is warm. No rash noted.  Psychiatric: She has a normal mood and affect.  Nursing note and vitals reviewed.   Assessment/Plan: 1. GAD (generalized anxiety disorder) Continue zoloft 50 mg. Sent to mail order pharmacy. Will increase to 100 mg if needed after exam time.   2. Other osteoporosis without current pathological fracture Bone density once weight restored.   3. Anorexia nervosa, restricting type Needs to focus seriously on her health over the summer. She is very underweight. She agrees.   4. Constipation, unspecified constipation type Increase miralax to daily to help with constipation and feelings of fullness and bloating.   5. Screening for genitourinary condition Normal.  - POCT urinalysis dipstick  6. Anxiety disorder, unspecified type As above.  - sertraline (ZOLOFT) 50 MG tablet; Take 1 tablet (50 mg total) by mouth daily.  Dispense: 90 tablet; Refill: 1   Follow-up:  3 months   Medical decision-making:  >15 minutes spent face to face with patient with more than 50% of appointment spent discussing diagnosis, management, follow-up, and reviewing of GAD, anorexia, constipation.

## 2018-02-24 ENCOUNTER — Encounter: Payer: Self-pay | Admitting: Pediatrics

## 2018-02-24 ENCOUNTER — Ambulatory Visit (INDEPENDENT_AMBULATORY_CARE_PROVIDER_SITE_OTHER): Payer: BLUE CROSS/BLUE SHIELD | Admitting: Pediatrics

## 2018-02-24 ENCOUNTER — Other Ambulatory Visit: Payer: Self-pay

## 2018-02-24 VITALS — BP 102/66 | HR 73 | Ht 64.57 in | Wt 93.8 lb

## 2018-02-24 DIAGNOSIS — F5001 Anorexia nervosa, restricting type: Secondary | ICD-10-CM | POA: Diagnosis not present

## 2018-02-24 DIAGNOSIS — K5900 Constipation, unspecified: Secondary | ICD-10-CM | POA: Diagnosis not present

## 2018-02-24 DIAGNOSIS — F419 Anxiety disorder, unspecified: Secondary | ICD-10-CM | POA: Diagnosis not present

## 2018-02-24 DIAGNOSIS — E440 Moderate protein-calorie malnutrition: Secondary | ICD-10-CM

## 2018-02-24 MED ORDER — SERTRALINE HCL 50 MG PO TABS: 50.0000 mg | ORAL_TABLET | Freq: Every day | ORAL | 1 refills | Status: DC

## 2018-02-24 NOTE — Progress Notes (Signed)
History was provided by the patient.  Gail Anderson is a 22 y.o. female who is here for anorexia, GAD, constipation, moderate malnutrition.  No primary care provider on file.   HPI:  Pt reports that the summer was pretty good and relaxing. Didn't do too much.  Went back to see old dietitian from high school and worked with her and therapist.  Couldn't get back in with [place].  Working with therapist and Geologist, engineering out of eating disorder center in [place].   Still following meal plan from the summer- more loosely- but trying to get back on track after getting off during school.   Anxiety is a lot better- no panic attacks.   Having periods.   Constipation ok- using miralax PRN.   24 hour recall:  B: oatmeal with berries, PB and toast and cookie butter S: go macro bar L: 2 slices of toast, cc egg, pb strawberries, orange S: breakfast bar  D: roasted sweet pot, veg, tofu S: cereal, almond milk, ensure   Supposed to be having 3 suppelements daily but only getting about 1. She says that she was doing better with that over the summer because she had her parents to help hold her accountable but that she has been slipping some since being back here. She is agreeable to ROI for her dietitian from summer so we can have meal plan and weight trend.   No LMP recorded.  Review of Systems  Constitutional: Negative for malaise/fatigue.  Eyes: Negative for double vision.  Respiratory: Negative for shortness of breath.   Cardiovascular: Negative for chest pain and palpitations.  Gastrointestinal: Negative for abdominal pain, constipation, diarrhea, nausea and vomiting.  Genitourinary: Negative for dysuria.  Musculoskeletal: Negative for joint pain and myalgias.  Skin: Negative for rash.  Neurological: Negative for dizziness and headaches.  Endo/Heme/Allergies: Does not bruise/bleed easily.  Psychiatric/Behavioral: Negative for depression. The patient is not nervous/anxious and does not have  insomnia.     Patient Active Problem List   Diagnosis Date Noted  . Constipation 08/19/2017  . GAD (generalized anxiety disorder) 08/01/2017  . Eating disorder 06/17/2017  . Osteoporosis 06/17/2017    Current Outpatient Medications on File Prior to Visit  Medication Sig Dispense Refill  . CRYSELLE-28 0.3-30 MG-MCG tablet   0  . lactobacillus acidophilus (BACID) TABS tablet Take 2 tablets by mouth 3 (three) times daily.    . polyethylene glycol powder (GLYCOLAX/MIRALAX) powder Take 17 g by mouth daily. 578 g 6  . sertraline (ZOLOFT) 50 MG tablet Take 1 tablet (50 mg total) by mouth daily. 90 tablet 1  . Multiple Vitamin (MULTIVITAMIN) tablet Take 1 tablet by mouth daily.     No current facility-administered medications on file prior to visit.     Allergies  Allergen Reactions  . Penicillins Rash     Physical Exam:    Vitals:   02/24/18 1521  BP: 102/66  Pulse: 73  Weight: 93 lb 12.8 oz (42.5 kg)  Height: 5' 4.57" (1.64 m)    Growth percentile SmartLinks can only be used for patients less than 63 years old.  Physical Exam  Constitutional: She appears well-developed. No distress.  Very thin  HENT:  Mouth/Throat: Oropharynx is clear and moist.  Neck: No thyromegaly present.  Cardiovascular: Normal rate and regular rhythm.  No murmur heard. Pulmonary/Chest: Breath sounds normal.  Abdominal: Soft. She exhibits no mass. There is no tenderness. There is no guarding.  Musculoskeletal: She exhibits no edema.  Lymphadenopathy:  She has no cervical adenopathy.  Neurological: She is alert.  Skin: Skin is warm. No rash noted.  Psychiatric: She has a normal mood and affect.  Nursing note and vitals reviewed.   Assessment/Plan: 1. Moderate malnutrition (HCC) Has lost some weight since I last saw her. While she may have gained over the summer, clearly the progress she had made has been lost. She is working with a Geologist, engineeringvirtual dietitian here, but is seeing a local therapist  who she just started with. We brainstormed some ways to make some changes and ROI signed for summer dietitian.   2. Anorexia nervosa, restricting type As above.   3. Constipation, unspecified constipation type Managed well with miralax PRN.   4. Anxiety disorder, unspecified type Managed well with zoloft 50 mg daily.  - sertraline (ZOLOFT) 50 MG tablet; Take 1 tablet (50 mg total) by mouth daily.  Dispense: 90 tablet; Refill: 1

## 2018-02-25 ENCOUNTER — Telehealth: Payer: Self-pay | Admitting: Pediatrics

## 2018-02-25 DIAGNOSIS — E440 Moderate protein-calorie malnutrition: Secondary | ICD-10-CM | POA: Insufficient documentation

## 2018-02-25 NOTE — Telephone Encounter (Signed)
LVM for Gail Anderson to obtain her fax number in order to send her a two-way consent.

## 2018-03-08 ENCOUNTER — Telehealth: Payer: Self-pay | Admitting: Pediatrics

## 2018-03-08 NOTE — Telephone Encounter (Signed)
Per Alfonso Ramus, dietician and therapist want patient to come in every other week for a nurse only visit for weight check. LVM for patient to call or email me so we can schedule a nurse only visit for this week.

## 2018-03-15 ENCOUNTER — Ambulatory Visit (INDEPENDENT_AMBULATORY_CARE_PROVIDER_SITE_OTHER): Payer: BLUE CROSS/BLUE SHIELD

## 2018-03-15 VITALS — BP 96/65 | HR 77 | Ht 64.37 in | Wt 94.8 lb

## 2018-03-15 DIAGNOSIS — Z1389 Encounter for screening for other disorder: Secondary | ICD-10-CM | POA: Diagnosis not present

## 2018-03-15 LAB — POCT URINALYSIS DIPSTICK
Bilirubin, UA: NEGATIVE
Blood, UA: NEGATIVE
Glucose, UA: NEGATIVE
KETONES UA: NEGATIVE
Leukocytes, UA: NEGATIVE
NITRITE UA: NEGATIVE
PH UA: 5 (ref 5.0–8.0)
PROTEIN UA: NEGATIVE
SPEC GRAV UA: 1.015 (ref 1.010–1.025)
UROBILINOGEN UA: NEGATIVE U/dL — AB

## 2018-03-15 NOTE — Progress Notes (Signed)
Pt here today for vitals check. Collaborated with NP- plan of care made. Follow up scheduled for 10/23.

## 2018-03-24 ENCOUNTER — Ambulatory Visit: Payer: Self-pay | Admitting: Pediatrics

## 2018-04-14 ENCOUNTER — Other Ambulatory Visit: Payer: Self-pay | Admitting: Pediatrics

## 2018-04-14 DIAGNOSIS — F5000 Anorexia nervosa, unspecified: Secondary | ICD-10-CM

## 2018-04-14 NOTE — Progress Notes (Signed)
Received communication from FNP at [facility]. They have faxed ROI from patient. Patient is requesting weekly weight and vitals checks at [facility] as part of her ongoing disordered eating care. I have faxed over an order for this to them.

## 2018-06-15 ENCOUNTER — Other Ambulatory Visit: Payer: Self-pay | Admitting: Pediatrics

## 2018-06-15 ENCOUNTER — Telehealth: Payer: Self-pay

## 2018-06-15 DIAGNOSIS — E440 Moderate protein-calorie malnutrition: Secondary | ICD-10-CM

## 2018-06-15 DIAGNOSIS — R630 Anorexia: Secondary | ICD-10-CM

## 2018-06-15 NOTE — Telephone Encounter (Signed)
EKG order placed. Will get labs at visit.

## 2018-06-15 NOTE — Telephone Encounter (Signed)
Called patient and made appointment for 1/21 at 4 (soonest she could come in). She will go ahead and call and schedule EKG. Routing to [place] to put in orders. Labs can be obtained on 1/21.

## 2018-06-16 ENCOUNTER — Encounter: Payer: Self-pay | Admitting: Pediatrics

## 2018-06-22 ENCOUNTER — Ambulatory Visit (INDEPENDENT_AMBULATORY_CARE_PROVIDER_SITE_OTHER): Payer: BLUE CROSS/BLUE SHIELD | Admitting: Pediatrics

## 2018-06-22 VITALS — BP 103/73 | HR 81 | Ht 64.76 in | Wt 89.4 lb

## 2018-06-22 DIAGNOSIS — F411 Generalized anxiety disorder: Secondary | ICD-10-CM

## 2018-06-22 DIAGNOSIS — F5001 Anorexia nervosa, restricting type: Secondary | ICD-10-CM | POA: Diagnosis not present

## 2018-06-22 DIAGNOSIS — M818 Other osteoporosis without current pathological fracture: Secondary | ICD-10-CM

## 2018-06-22 DIAGNOSIS — Z1389 Encounter for screening for other disorder: Secondary | ICD-10-CM

## 2018-06-22 DIAGNOSIS — E440 Moderate protein-calorie malnutrition: Secondary | ICD-10-CM

## 2018-06-22 LAB — POCT URINALYSIS DIPSTICK
Bilirubin, UA: NEGATIVE
Blood, UA: NEGATIVE
Glucose, UA: NEGATIVE
KETONES UA: NEGATIVE
Leukocytes, UA: NEGATIVE
Nitrite, UA: NEGATIVE
Protein, UA: NEGATIVE
SPEC GRAV UA: 1.015 (ref 1.010–1.025)
Urobilinogen, UA: NEGATIVE E.U./dL — AB
pH, UA: 7 (ref 5.0–8.0)

## 2018-06-22 MED ORDER — SERTRALINE HCL 100 MG PO TABS: 100.0000 mg | ORAL_TABLET | Freq: Every day | ORAL | 1 refills | Status: DC

## 2018-06-22 MED ORDER — OLANZAPINE 2.5 MG PO TABS: 2.5000 mg | ORAL_TABLET | Freq: Every day | ORAL | 1 refills | Status: DC

## 2018-06-22 NOTE — Progress Notes (Signed)
History was provided by the patient.  Gail Anderson is a 23 y.o. female who is here for anorexia, GAD, constipation, moderate malnutrition.  No primary care provider on file.   HPI:     Goals for the visit: Discuss medication changes, EKG, check electrolytes  Mood: Break was hard. She's had lots of changes recently, her old restrictive habits have resurfaced. She's continued to take the sertraline, she doesn't think it's helping.  Periods: She's on OCPs. Her periods are short, but painful. Bleeds for 2-3 days.  Cramping badly, bloating.  Constipation: 1x every few weeks. Had a BM yesterday but it wasn't a substantial Bm.  Sleep: hard to fall asleep, can't stay asleep, daytime napping, new as of winter break.  Exercise: Hot yoga 2-3x per week, walking half mile to class everyday.    Water: hydration has been good- she uses a water bottle Dietician: Clydie Braun, in person, at [employer] Therpist: Andrey Campanile @ [employer]   24 hour recall: (3 ensures per day and exercise)  B: oatmeal with strawberry, blueberry, PB. Coffee S: luna bar and ensure L: skinny pop, pretzels, english muffin (1/2 PB and 1/2 cc), carrots and hummus. S: rice cake, PB, berries, ensure D: rice, greens, vegetable, tofu, peanut sauce S: instant oatmeal, dark chocolate   Past few weeks is the reinitation of ensures.  Review of systems:  Headaches: no Dizziness: stand up too fast, sometimes  Abdominal pain: no  Nausea/vomiting: some nausea Dysphagia: no Odonophagia: no  Constipation:  As per above Diarrhea: No Tooth decay: no  Reflux: No Heart palpitations: No  Heat/cold intolerance: maybe a little colder than her roomattes Skin changes: breaking out a little more recently Hair loss: No Mood/anxiety: as per above   No LMP recorded.    Patient Active Problem List   Diagnosis Date Noted  . Moderate malnutrition (HCC) 02/25/2018  . Constipation 08/19/2017  . GAD (generalized anxiety disorder)  08/01/2017  . Eating disorder 06/17/2017  . Osteoporosis 06/17/2017    Current Outpatient Medications on File Prior to Visit  Medication Sig Dispense Refill  . CRYSELLE-28 0.3-30 MG-MCG tablet   0  . lactobacillus acidophilus (BACID) TABS tablet Take 2 tablets by mouth 3 (three) times daily.    . polyethylene glycol powder (GLYCOLAX/MIRALAX) powder Take 17 g by mouth daily. 578 g 6  . sertraline (ZOLOFT) 50 MG tablet Take 1 tablet (50 mg total) by mouth daily. 90 tablet 1  . Multiple Vitamin (MULTIVITAMIN) tablet Take 1 tablet by mouth daily.     No current facility-administered medications on file prior to visit.     Allergies  Allergen Reactions  . Penicillins Rash     Physical Exam:    Vitals:   06/22/18 1636 06/22/18 1647  BP: 92/62 103/73  Pulse: 62 81  Weight: 89 lb 6.4 oz (40.6 kg)   Height: 5' 4.76" (1.645 m)     Growth percentile SmartLinks can only be used for patients less than 96 years old.  Physical Exam Vitals signs and nursing note reviewed.  Constitutional:      General: She is not in acute distress.    Appearance: She is well-developed.     Comments: Very thin  Neck:     Thyroid: No thyromegaly.  Cardiovascular:     Rate and Rhythm: Normal rate and regular rhythm.     Heart sounds: No murmur.  Pulmonary:     Breath sounds: Normal breath sounds.  Abdominal:     Palpations: Abdomen  is soft. There is no mass.     Tenderness: There is no abdominal tenderness. There is no guarding.  Lymphadenopathy:     Cervical: No cervical adenopathy.  Skin:    General: Skin is warm.     Findings: No rash.  Neurological:     Mental Status: She is alert.     Assessment/Plan: 1. Moderate malnutrition (HCC)  - continue to follow with treatment team   2. Anorexia nervosa, restricting type -ordered  CMP, CBC, mag/phos, vitamin D  - ordered EKG  - Start Zyprexa 2.5 mg daily  3. Constipation, unspecified constipation type - Encourage miralax use    4.  Anxiety disorder, unspecified type - Increase zoloft 100 mg daily   [name], MS4

## 2018-06-22 NOTE — Patient Instructions (Addendum)
Thank you for coming to clinic today. We did a blood test today to check your electrolyte numbers.  We will review these results next visit and they will be available to you on mychart.  Please call and schedule a time to get an EKG this week.  Call this number to schedule: [phone].  We recommended a change in your medications today.  Your zoloft was increased to 100 mg daily. We prescribed a new medication called zyprexa (olanzapine) today at 2.5 mg daily.  Please take this medication at night, as it may cause drowsiness.  Other side effects that may occur are stomach upset or dizziness.   Please continue to see your treatment team at [facility].  We recommend that you do not do any form of exercise including hot yoga.  Continue to take the ensure supplements as recommended by your dietician team.  Your goal is to have one soft bowel movement daily.  To ensure this, we recommend taking 1 capful of miralax daily.  We will follow up on bowel movements at your follow up appointment.

## 2018-06-23 LAB — COMPREHENSIVE METABOLIC PANEL
AG Ratio: 1.6 (calc) (ref 1.0–2.5)
ALKALINE PHOSPHATASE (APISO): 36 U/L (ref 33–115)
ALT: 15 U/L (ref 6–29)
AST: 18 U/L (ref 10–30)
Albumin: 4.5 g/dL (ref 3.6–5.1)
BUN: 8 mg/dL (ref 7–25)
CO2: 28 mmol/L (ref 20–32)
CREATININE: 0.74 mg/dL (ref 0.50–1.10)
Calcium: 9.6 mg/dL (ref 8.6–10.2)
Chloride: 106 mmol/L (ref 98–110)
Globulin: 2.8 g/dL (calc) (ref 1.9–3.7)
Glucose, Bld: 80 mg/dL (ref 65–99)
Potassium: 4.6 mmol/L (ref 3.5–5.3)
Sodium: 141 mmol/L (ref 135–146)
Total Bilirubin: 0.5 mg/dL (ref 0.2–1.2)
Total Protein: 7.3 g/dL (ref 6.1–8.1)

## 2018-06-23 LAB — CBC WITH DIFFERENTIAL/PLATELET
ABSOLUTE MONOCYTES: 382 {cells}/uL (ref 200–950)
BASOS PCT: 1.5 %
Basophils Absolute: 80 cells/uL (ref 0–200)
Eosinophils Absolute: 223 cells/uL (ref 15–500)
Eosinophils Relative: 4.2 %
HCT: 41.8 % (ref 35.0–45.0)
Hemoglobin: 14.2 g/dL (ref 11.7–15.5)
Lymphs Abs: 2825 cells/uL (ref 850–3900)
MCH: 31.3 pg (ref 27.0–33.0)
MCHC: 34 g/dL (ref 32.0–36.0)
MCV: 92.1 fL (ref 80.0–100.0)
MPV: 10 fL (ref 7.5–12.5)
Monocytes Relative: 7.2 %
Neutro Abs: 1791 cells/uL (ref 1500–7800)
Neutrophils Relative %: 33.8 %
Platelets: 339 10*3/uL (ref 140–400)
RBC: 4.54 10*6/uL (ref 3.80–5.10)
RDW: 12.2 % (ref 11.0–15.0)
Total Lymphocyte: 53.3 %
WBC: 5.3 10*3/uL (ref 3.8–10.8)

## 2018-06-23 LAB — PHOSPHORUS: Phosphorus: 3.3 mg/dL (ref 2.5–4.5)

## 2018-06-23 LAB — VITAMIN D 25 HYDROXY (VIT D DEFICIENCY, FRACTURES): Vit D, 25-Hydroxy: 29 ng/mL — ABNORMAL LOW (ref 30–100)

## 2018-06-23 LAB — MAGNESIUM: Magnesium: 2.3 mg/dL (ref 1.5–2.5)

## 2018-06-24 NOTE — Progress Notes (Signed)
I have reviewed the  Medical student's note and plan of care and helped develop the plan as necessary.  Physical Exam  Constitutional: She is oriented to person, place, and time.  Very thin  HENT:  Head: Normocephalic and atraumatic.  Eyes: Pupils are equal, round, and reactive to light.  Neck: Normal range of motion.  Cardiovascular: Normal rate.  Respiratory: Effort normal.  GI: Soft.  Musculoskeletal: Normal range of motion.  Neurological: She is alert and oriented to person, place, and time.  Skin: Skin is warm and dry.  Psychiatric: She has a normal mood and affect.   Gail Anderson now has a full team who is based around [place] in [place]. We have created a contract regarding her ability to stay in outpatient care. While she is motivated to want to recover, she is still struggling significantly. I discussed today that she needs to be getting in 3 ensures a day as well as discontinuing hot yoga as this is further adding to energy expenditure. She was agreeable. I will see her in 2 weeks and have her see the NP at [facility] in 1 week for weight and vitals.

## 2018-07-06 ENCOUNTER — Ambulatory Visit (INDEPENDENT_AMBULATORY_CARE_PROVIDER_SITE_OTHER): Payer: BLUE CROSS/BLUE SHIELD | Admitting: Pediatrics

## 2018-07-06 VITALS — BP 97/68 | HR 77 | Ht 64.37 in | Wt 88.6 lb

## 2018-07-06 DIAGNOSIS — F411 Generalized anxiety disorder: Secondary | ICD-10-CM | POA: Diagnosis not present

## 2018-07-06 DIAGNOSIS — M818 Other osteoporosis without current pathological fracture: Secondary | ICD-10-CM

## 2018-07-06 DIAGNOSIS — F5001 Anorexia nervosa, restricting type: Secondary | ICD-10-CM

## 2018-07-06 DIAGNOSIS — Z1389 Encounter for screening for other disorder: Secondary | ICD-10-CM

## 2018-07-06 DIAGNOSIS — K5900 Constipation, unspecified: Secondary | ICD-10-CM | POA: Diagnosis not present

## 2018-07-06 DIAGNOSIS — E440 Moderate protein-calorie malnutrition: Secondary | ICD-10-CM

## 2018-07-06 DIAGNOSIS — F50019 Anorexia nervosa, restricting type, unspecified: Secondary | ICD-10-CM

## 2018-07-06 LAB — POCT URINALYSIS DIPSTICK
Bilirubin, UA: NEGATIVE
Blood, UA: NEGATIVE
Glucose, UA: NEGATIVE
Ketones, UA: NEGATIVE
Leukocytes, UA: NEGATIVE
Nitrite, UA: NEGATIVE
PH UA: 7 (ref 5.0–8.0)
Protein, UA: NEGATIVE
Spec Grav, UA: <=1.005 — AB (ref 1.010–1.025)
UROBILINOGEN UA: NEGATIVE U/dL — AB

## 2018-07-06 NOTE — Progress Notes (Signed)
History was provided by the patient.  Gail Anderson is a 23 y.o. female who is here for anorexia, moderate malnutrition.  No primary care provider on file.   HPI:  Pt reports that she is planning to enter [facility]. She is here for medical clearance today.   Daily walks about 2 miles and hot yoga.   Restricting but not at her worst currently- working to at least have stable eating habits so that she can make it into treatment and not need any hospitalization. Trying to get in at least 2 ensure shakes a day.   Is considering stopping her OCP so she can watch for return of menses during her weight restoration. She is currently sexually active with one partner. We discussed the risks and benefits of this, specifically to include unwanted pregnancy.   No LMP recorded.  Review of Systems  Constitutional: Negative for malaise/fatigue.  Eyes: Negative for double vision.  Respiratory: Negative for shortness of breath.   Cardiovascular: Positive for chest pain and palpitations.  Gastrointestinal: Positive for constipation. Negative for abdominal pain, diarrhea, nausea and vomiting.  Genitourinary: Negative for dysuria.  Musculoskeletal: Negative for joint pain and myalgias.  Skin: Negative for rash.  Neurological: Positive for dizziness. Negative for headaches.  Endo/Heme/Allergies: Does not bruise/bleed easily.  Psychiatric/Behavioral: Negative for depression. The patient is nervous/anxious.     Patient Active Problem List   Diagnosis Date Noted  . Moderate malnutrition (HCC) 02/25/2018  . Constipation 08/19/2017  . GAD (generalized anxiety disorder) 08/01/2017  . Eating disorder 06/17/2017  . Osteoporosis 06/17/2017    Current Outpatient Medications on File Prior to Visit  Medication Sig Dispense Refill  . CRYSELLE-28 0.3-30 MG-MCG tablet   0  . lactobacillus acidophilus (BACID) TABS tablet Take 2 tablets by mouth 3 (three) times daily.    Marland Kitchen. OLANZapine  (ZYPREXA) 2.5 MG tablet Take 1 tablet (2.5 mg total) by mouth at bedtime. 30 tablet 1  . polyethylene glycol powder (GLYCOLAX/MIRALAX) powder Take 17 g by mouth daily. 578 g 6  . sertraline (ZOLOFT) 100 MG tablet Take 1 tablet (100 mg total) by mouth daily. 30 tablet 1  . Multiple Vitamin (MULTIVITAMIN) tablet Take 1 tablet by mouth daily.     No current facility-administered medications on file prior to visit.     Allergies  Allergen Reactions  . Penicillins Rash      Physical Exam:    Vitals:   07/06/18 1513 07/06/18 1523  BP: 92/62 97/68  Pulse: 64 77  Weight: 88 lb 9.6 oz (40.2 kg)   Height: 5' 4.37" (1.635 m)     Growth percentile SmartLinks can only be used for patients less than 21 years old.  Physical Exam Constitutional:      Appearance: She is well-developed.  HENT:     Head: Normocephalic.     Mouth/Throat:     Mouth: Mucous membranes are moist.  Eyes:     Pupils: Pupils are equal, round, and reactive to light.  Neck:     Musculoskeletal: Normal range of motion.     Thyroid: No thyromegaly.  Cardiovascular:     Rate and Rhythm: Normal rate and regular rhythm.     Heart sounds: Normal heart sounds.  Pulmonary:     Effort: Pulmonary effort is normal.     Breath sounds: Normal breath sounds.  Abdominal:     General: Bowel sounds are normal.     Palpations: Abdomen is soft.  Tenderness: There is no abdominal tenderness.  Musculoskeletal: Normal range of motion.  Skin:    General: Skin is dry.     Capillary Refill: Capillary refill takes 2 to 3 seconds.     Comments: Cool  Neurological:     Mental Status: She is alert and oriented to person, place, and time.     Assessment/Plan: 1. Moderate malnutrition (HCC) Labs today for admission to residential. Paperwork faxed to [facility] with initial labs.  - Comprehensive metabolic panel - CBC - Phosphorus - Magnesium - Pregnancy, urine - Drugs of abuse screen w/o alc (for BH OP) - QuantiFERON-TB  Gold Plus - Measles/Mumps/Rubella Immunity  2. GAD (generalized anxiety disorder) Continue zoloft and zyprexa. Some sedation from zyprexa so will not increase today.   3. Constipation, unspecified constipation type Continue miralax.   4. Other osteoporosis without current pathological fracture Discussed oral estrogen in the setting of her bone density issues. It is not overall evidence based as hugely helpful, and therefore she could stop her OCP if she wishes. She should have a repeat bone density post weight restoration.   5. Anorexia nervosa, restricting type Weight is down slightly but stable overall. Vitals stable.   6. Screening for genitourinary condition WNL.  - POCT urinalysis dipstick

## 2018-07-07 ENCOUNTER — Ambulatory Visit (HOSPITAL_COMMUNITY)
Admission: RE | Admit: 2018-07-07 | Discharge: 2018-07-07 | Disposition: A | Discharge status: Home / Self Care | Payer: BLUE CROSS/BLUE SHIELD | Source: Ambulatory Visit | Attending: Pediatrics | Admitting: Pediatrics

## 2018-07-07 DIAGNOSIS — E440 Moderate protein-calorie malnutrition: Secondary | ICD-10-CM | POA: Insufficient documentation

## 2018-07-07 DIAGNOSIS — F5001 Anorexia nervosa, restricting type: Secondary | ICD-10-CM | POA: Diagnosis present

## 2018-07-07 LAB — MEASLES/MUMPS/RUBELLA IMMUNITY
Mumps IgG: 20.2 AU/mL
Rubella: <0.9 index — ABNORMAL LOW
Rubeola IgG: 82.1 AU/mL

## 2018-07-07 LAB — COMPREHENSIVE METABOLIC PANEL
AG Ratio: 2 (calc) (ref 1.0–2.5)
ALT: 17 U/L (ref 6–29)
AST: 19 U/L (ref 10–30)
Albumin: 4.9 g/dL (ref 3.6–5.1)
Alkaline phosphatase (APISO): 40 U/L (ref 31–125)
BUN: 10 mg/dL (ref 7–25)
CO2: 26 mmol/L (ref 20–32)
Calcium: 9.7 mg/dL (ref 8.6–10.2)
Chloride: 103 mmol/L (ref 98–110)
Creat: 0.78 mg/dL (ref 0.50–1.10)
GLOBULIN: 2.5 g/dL (ref 1.9–3.7)
Glucose, Bld: 72 mg/dL (ref 65–99)
Potassium: 4.3 mmol/L (ref 3.5–5.3)
Sodium: 139 mmol/L (ref 135–146)
Total Bilirubin: 0.5 mg/dL (ref 0.2–1.2)
Total Protein: 7.4 g/dL (ref 6.1–8.1)

## 2018-07-07 LAB — CBC
HCT: 41 % (ref 35.0–45.0)
Hemoglobin: 14 g/dL (ref 11.7–15.5)
MCH: 31.8 pg (ref 27.0–33.0)
MCHC: 34.1 g/dL (ref 32.0–36.0)
MCV: 93.2 fL (ref 80.0–100.0)
MPV: 10 fL (ref 7.5–12.5)
Platelets: 306 10*3/uL (ref 140–400)
RBC: 4.4 10*6/uL (ref 3.80–5.10)
RDW: 12.3 % (ref 11.0–15.0)
WBC: 5.7 10*3/uL (ref 3.8–10.8)

## 2018-07-07 LAB — PHOSPHORUS: Phosphorus: 3.5 mg/dL (ref 2.5–4.5)

## 2018-07-07 LAB — MAGNESIUM: Magnesium: 2.2 mg/dL (ref 1.5–2.5)

## 2018-07-08 LAB — QUANTIFERON-TB GOLD PLUS
Mitogen-NIL: >10 IU/mL
NIL: 0.01 IU/mL
QuantiFERON-TB Gold Plus: NEGATIVE
TB1-NIL: 0 IU/mL
TB2-NIL: 0 IU/mL

## 2018-07-14 LAB — DRUGS OF ABUSE SCREEN W/O ALC, ROUTINE URINE
AMPHETAMINES (1000 ng/mL SCRN): NEGATIVE
BARBITURATES: NEGATIVE
BENZODIAZEPINES: NEGATIVE
COCAINE METABOLITES: NEGATIVE
MARIJUANA MET (50 ng/mL SCRN): NEGATIVE
METHADONE: NEGATIVE
METHAQUALONE: NEGATIVE
OPIATES: NEGATIVE
PHENCYCLIDINE: NEGATIVE
PROPOXYPHENE: NEGATIVE

## 2018-07-16 ENCOUNTER — Telehealth: Payer: Self-pay

## 2018-07-16 NOTE — Telephone Encounter (Signed)
Theodoro Kos called with [facility]. She wanted to make provider aware patient is getting admitted today in [place]. Her contact number is [phone]. Routing to [employer].

## 2018-07-19 NOTE — Telephone Encounter (Signed)
Noted. Thanks.

## 2018-07-25 ENCOUNTER — Other Ambulatory Visit: Payer: Self-pay | Admitting: Pediatrics

## 2018-07-25 DIAGNOSIS — F5001 Anorexia nervosa, restricting type: Secondary | ICD-10-CM

## 2018-08-07 ENCOUNTER — Other Ambulatory Visit: Payer: Self-pay | Admitting: Pediatrics

## 2018-08-07 DIAGNOSIS — F419 Anxiety disorder, unspecified: Secondary | ICD-10-CM

## 2019-02-10 ENCOUNTER — Ambulatory Visit (INDEPENDENT_AMBULATORY_CARE_PROVIDER_SITE_OTHER): Payer: BC Managed Care – PPO | Admitting: Family

## 2019-02-10 ENCOUNTER — Other Ambulatory Visit: Payer: Self-pay

## 2019-02-10 ENCOUNTER — Encounter: Payer: Self-pay | Admitting: Family

## 2019-02-10 VITALS — BP 80/57 | HR 72 | Ht 65.0 in | Wt 95.0 lb

## 2019-02-10 DIAGNOSIS — F5001 Anorexia nervosa, restricting type: Secondary | ICD-10-CM | POA: Diagnosis not present

## 2019-02-10 DIAGNOSIS — F411 Generalized anxiety disorder: Secondary | ICD-10-CM | POA: Diagnosis not present

## 2019-02-10 DIAGNOSIS — F50019 Anorexia nervosa, restricting type, unspecified: Secondary | ICD-10-CM

## 2019-02-10 DIAGNOSIS — Z1389 Encounter for screening for other disorder: Secondary | ICD-10-CM

## 2019-02-10 DIAGNOSIS — K5900 Constipation, unspecified: Secondary | ICD-10-CM | POA: Diagnosis not present

## 2019-02-10 LAB — POCT URINALYSIS DIPSTICK
Bilirubin, UA: NEGATIVE
Blood, UA: NEGATIVE
Glucose, UA: NEGATIVE
Ketones, UA: NEGATIVE
Leukocytes, UA: NEGATIVE
Nitrite, UA: NEGATIVE
Protein, UA: NEGATIVE
Spec Grav, UA: 1.02 (ref 1.010–1.025)
Urobilinogen, UA: NEGATIVE E.U./dL — AB
pH, UA: 7 (ref 5.0–8.0)

## 2019-02-10 LAB — CBC WITH DIFFERENTIAL/PLATELET
Absolute Monocytes: 304 cells/uL (ref 200–950)
Basophils Absolute: 92 cells/uL (ref 0–200)
Basophils Relative: 2.1 %
Eosinophils Absolute: 211 cells/uL (ref 15–500)
Eosinophils Relative: 4.8 %
HCT: 44.6 % (ref 35.0–45.0)
Hemoglobin: 14.6 g/dL (ref 11.7–15.5)
Lymphs Abs: 1839 cells/uL (ref 850–3900)
MCH: 29.6 pg (ref 27.0–33.0)
MCHC: 32.7 g/dL (ref 32.0–36.0)
MCV: 90.5 fL (ref 80.0–100.0)
MPV: 10.9 fL (ref 7.5–12.5)
Monocytes Relative: 6.9 %
Neutro Abs: 1954 cells/uL (ref 1500–7800)
Neutrophils Relative %: 44.4 %
Platelets: 251 10*3/uL (ref 140–400)
RBC: 4.93 10*6/uL (ref 3.80–5.10)
RDW: 13.9 % (ref 11.0–15.0)
Total Lymphocyte: 41.8 %
WBC: 4.4 10*3/uL (ref 3.8–10.8)

## 2019-02-10 MED ORDER — SERTRALINE HCL 100 MG PO TABS: 100.0000 mg | ORAL_TABLET | Freq: Every day | ORAL | 0 refills | Status: DC

## 2019-02-10 NOTE — Progress Notes (Signed)
History was provided by the patient.  Gail Anderson is a 23 y.o. female who is here for eating disorder.   PCP confirmed? Yes.    Verneda SkillHacker, Caroline T, FNP  HPI:  Pt was in residential care in [place] and did well. Concerned that she has relapsed over the past month since school started.  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 Lives in apartment with roommates and feels that now that she has less accountability, she isn't doing as well with her eating plan. Her team would like her to be seen in the office weekly Out of Zoloft for 2 weeks and needs refill. Would like to increase to 100mg  daily. Start with 50mg  for a week and then increase to 100mg . SOB with activity. Walks to one class/day which takes about 10 min each way and feels  Denies plan/desire to hurt herself, laxative use, binging/purging, cutting. Constipation- increase Miralax to daily Last EKG in February and lab work done in April Seeing Annie-therapist- 2x/week [facility] Au[facility]lie williams-nutrition 1x/wk  Review of Systems  Constitutional: Positive for weight loss. Negative for chills, fever and malaise/fatigue.  HENT: Negative for sore throat.   Eyes: Negative for blurred vision and double vision.  Respiratory: Positive for shortness of breath (with activity, s/p deconditioning ). Negative for cough.   Cardiovascular: Negative for chest pain and palpitations.  Gastrointestinal: Positive for constipation. Negative for abdominal pain and heartburn.  Genitourinary: Negative for dysuria and urgency.  Musculoskeletal: Negative for joint pain and myalgias.  Skin: Negative for rash.  Neurological: Negative for dizziness and headaches.  Psychiatric/Behavioral: Negative for depression and suicidal ideas. The patient is nervous/anxious.     SOB with activity  constipation  Patient Active Problem List   Diagnosis Date Noted  . Moderate malnutrition (HCC) 02/25/2018  . Constipation 08/19/2017  . GAD (generalized  anxiety disorder) 08/01/2017  . Eating disorder 06/17/2017  . Osteoporosis 06/17/2017    Current Outpatient Medications on File Prior to Visit  Medication Sig Dispense Refill  . CRYSELLE-28 0.3-30 MG-MCG tablet   0  . lactobacillus acidophilus (BACID) TABS tablet Take 2 tablets by mouth 3 (three) times daily.    . Multiple Vitamin (MULTIVITAMIN) tablet Take 1 tablet by mouth daily.    Marland Kitchen. OLANZapine (ZYPREXA) 2.5 MG tablet Take 1 tablet (2.5 mg total) by mouth at bedtime. 30 tablet 1  . polyethylene glycol powder (GLYCOLAX/MIRALAX) powder Take 17 g by mouth daily. 578 g 6  . sertraline (ZOLOFT) 100 MG tablet Take 1 tablet (100 mg total) by mouth daily. 30 tablet 1  . sertraline (ZOLOFT) 50 MG tablet TAKE 1 TABLET BY MOUTH DAILY (Patient not taking: Reported on 02/10/2019) 90 tablet 1   No current facility-administered medications on file prior to visit.     Allergies  Allergen Reactions  . Penicillins Rash    Physical Exam:    Vitals:   02/10/19 0907  BP: (!) 80/57  Pulse: 72  Weight: 95 lb (43.1 kg)  Height: 5\' 5"  (1.651 m)   Wt Readings from Last 3 Encounters:  02/10/19 95 lb (43.1 kg)  07/06/18 88 lb 9.6 oz (40.2 kg)  06/22/18 89 lb 6.4 oz (40.6 kg)    Growth percentile SmartLinks can only be used for patients less than 649 years old. No LMP recorded.  Physical Exam Constitutional:      General: She is not in acute distress.    Appearance: She is not toxic-appearing.  HENT:  Head: Normocephalic.     Mouth/Throat:     Mouth: Mucous membranes are moist.     Pharynx: No posterior oropharyngeal erythema.  Eyes:     Extraocular Movements: Extraocular movements intact.     Pupils: Pupils are equal, round, and reactive to light.  Neck:     Musculoskeletal: Normal range of motion.     Comments: No parotid gland enlargement Cardiovascular:     Rate and Rhythm: Normal rate and regular rhythm.     Heart sounds: No murmur.  Pulmonary:     Effort: Pulmonary effort is  normal.  Abdominal:     General: Abdomen is flat.  Musculoskeletal: Normal range of motion.        General: No swelling.  Lymphadenopathy:     Cervical: No cervical adenopathy.  Skin:    General: Skin is warm and dry.     Coloration: Skin is not pale.     Comments: Negative Russell sign  No Lanugo noted   Neurological:     General: No focal deficit present.     Mental Status: She is alert and oriented to person, place, and time.  Psychiatric:        Mood and Affect: Mood normal.      Assessment/Plan: 1. Anorexia nervosa, restricting type -will repeat labs for DE and also for zyprexa monitoring -BP is low today; continue with meal plan, therapy -I feel a large part of her concerning symptoms is r/t abrupt discontinuation of SSRI.  -weekly check-ins with Wardell Honour at [facility] for weight checks.  -two week med check with me by video  - Amylase - Comprehensive metabolic panel - EKG 73-ZHGD - Ferritin - Lipase - Phosphorus - Magnesium - Sedimentation rate - Thyroid Panel With TSH - VITAMIN D 25 Hydroxy (Vit-D Deficiency, Fractures) - Hemoglobin A1c - sertraline (ZOLOFT) 100 MG tablet; Take 1 tablet (100 mg total) by mouth daily.  Dispense: 90 tablet; Refill: 0 - CBC with Differential  2. Screening for genitourinary condition -WNL - POCT urinalysis dipstick  3. GAD (generalized anxiety disorder) -restart sertraline 100 mg  -take 50 mg x 3 days, then increase to 100 mg as tolerable. -return precautions reviewed   4. Constipation, unspecified constipation type -miralax 1-2 capfuls in 8-10 ounces of water daily

## 2019-02-11 LAB — FERRITIN: Ferritin: 76 ng/mL (ref 16–154)

## 2019-02-11 LAB — THYROID PANEL WITH TSH
Free Thyroxine Index: 1.7 (ref 1.4–3.8)
T3 Uptake: 24 % (ref 22–35)
T4, Total: 7.2 ug/dL (ref 5.1–11.9)
TSH: 0.99 mIU/L

## 2019-02-11 LAB — COMPREHENSIVE METABOLIC PANEL
AG Ratio: 1.8 (calc) (ref 1.0–2.5)
ALT: 11 U/L (ref 6–29)
AST: 21 U/L (ref 10–30)
Albumin: 5 g/dL (ref 3.6–5.1)
Alkaline phosphatase (APISO): 48 U/L (ref 31–125)
BUN: 9 mg/dL (ref 7–25)
CO2: 29 mmol/L (ref 20–32)
Calcium: 10.2 mg/dL (ref 8.6–10.2)
Chloride: 103 mmol/L (ref 98–110)
Creat: 0.76 mg/dL (ref 0.50–1.10)
Globulin: 2.8 g/dL (calc) (ref 1.9–3.7)
Glucose, Bld: 78 mg/dL (ref 65–99)
Potassium: 4.2 mmol/L (ref 3.5–5.3)
Sodium: 141 mmol/L (ref 135–146)
Total Bilirubin: 0.6 mg/dL (ref 0.2–1.2)
Total Protein: 7.8 g/dL (ref 6.1–8.1)

## 2019-02-11 LAB — VITAMIN D 25 HYDROXY (VIT D DEFICIENCY, FRACTURES): Vit D, 25-Hydroxy: 25 ng/mL — ABNORMAL LOW (ref 30–100)

## 2019-02-11 LAB — LIPASE: Lipase: 18 U/L (ref 7–60)

## 2019-02-11 LAB — HEMOGLOBIN A1C
Hgb A1c MFr Bld: 5.1 % of total Hgb (ref <5.7)
Mean Plasma Glucose: 100 (calc)
eAG (mmol/L): 5.5 (calc)

## 2019-02-11 LAB — MAGNESIUM: Magnesium: 2.3 mg/dL (ref 1.5–2.5)

## 2019-02-11 LAB — AMYLASE: Amylase: 115 U/L — ABNORMAL HIGH (ref 21–101)

## 2019-02-11 LAB — SEDIMENTATION RATE: Sed Rate: 2 mm/h (ref 0–20)

## 2019-02-11 LAB — PHOSPHORUS: Phosphorus: 4.7 mg/dL — ABNORMAL HIGH (ref 2.5–4.5)

## 2019-02-12 ENCOUNTER — Encounter: Payer: Self-pay | Admitting: Family

## 2019-02-17 ENCOUNTER — Ambulatory Visit: Payer: BC Managed Care – PPO | Admitting: Family

## 2019-02-24 ENCOUNTER — Telehealth: Payer: Self-pay

## 2019-02-24 NOTE — Telephone Encounter (Deleted)
Gail Anderson sent secure e-mail to document weight check performed in clinic.  "Good morning, MR's weight was down to 119.2 this week (down 2 lbs from last week). I'm concerned that she still seems to be at the low end of the weight goal.  I'll continue to monitor this, but wanted you all to be aware and see if you had any thoughts."  Gail Anderson

## 2019-02-24 NOTE — Telephone Encounter (Signed)
Gail Anderson was seen at [facility]. Her weight was 95.5 lb. Her urinalysis was normal with specific gravity of 1.005, pH 6.0, neg LE, neg nitrite, neg protein, neg glucose, neg ketones, normal urobilinogen, neg bilirubin, neg blood.

## 2019-02-24 NOTE — Telephone Encounter (Signed)
A user error has taken place.

## 2019-03-03 ENCOUNTER — Ambulatory Visit (INDEPENDENT_AMBULATORY_CARE_PROVIDER_SITE_OTHER): Payer: BC Managed Care – PPO | Admitting: Pediatrics

## 2019-03-03 DIAGNOSIS — Z91040 Latex allergy status: Secondary | ICD-10-CM | POA: Diagnosis not present

## 2019-03-03 DIAGNOSIS — E440 Moderate protein-calorie malnutrition: Secondary | ICD-10-CM

## 2019-03-03 DIAGNOSIS — F411 Generalized anxiety disorder: Secondary | ICD-10-CM

## 2019-03-03 DIAGNOSIS — F5001 Anorexia nervosa, restricting type: Secondary | ICD-10-CM | POA: Diagnosis not present

## 2019-03-03 DIAGNOSIS — Z91018 Allergy to other foods: Secondary | ICD-10-CM

## 2019-03-03 NOTE — Progress Notes (Signed)
THIS RECORD MAY CONTAIN CONFIDENTIAL INFORMATION THAT SHOULD NOT BE RELEASED WITHOUT REVIEW OF THE SERVICE PROVIDER.  Virtual Follow-Up Visit via Video Note  I connected with Gail Anderson 's patient  on 03/03/19 at 11:00 AM EDT by a video enabled telemedicine application and verified that I am speaking with the correct person using two identifiers.    This patient visit was completed through the use of an audio/video or telephone encounter in the setting of the State of Emergency due to the COVID-19 Pandemic.  I discussed that the purpose of this telehealth visit is to provide medical care while limiting exposure to the novel coronavirus.       I discussed the limitations of evaluation and management by telemedicine and the availability of in person appointments.    The patient expressed understanding and agreed to proceed.   The patient was physically located at home in [place] or a state in which I am permitted to provide care. The patient and/or parent/guardian understood that s/he may incur co-pays and cost sharing, and agreed to the telemedicine visit. The visit was reasonable and appropriate under the circumstances given the patient's presentation at the time.   The patient and/or parent/guardian has been advised of the potential risks and limitations of this mode of treatment (including, but not limited to, the absence of in-person examination) and has agreed to be treated using telemedicine. The patient's/patient's family's questions regarding telemedicine have been answered.    As this visit was completed in an ambulatory virtual setting, the patient and/or parent/guardian has also been advised to contact their provider's office for worsening conditions, and seek emergency medical treatment and/or call 911 if the patient deems either necessary.    Gail Anderson is a 23 y.o. female referred by Verneda SkillHacker, Caroline T, FNP here today for follow-up of anorexia, GAD.   Growth Chart Viewed?  not applicable  Previsit planning completed:  yes   History was provided by the patient.  PCP Confirmed?  yes  My Chart Activated?   yes    Plan from Last Visit:   Restart sertraline, came out of IOP, continue with treatment team   Chief Complaint: Check in for anorexia   History of Present Illness:   Started to struggle when she was in IOP and had a relapse in august after she discharged.  Working with Verlon AuLeslie and Primitivo GauzeAnnie  Saw Vicki Wood last week and sees her again today.   Feels like sertraline is fine right now. She was off for a few weeks but back on. She has been really anxious the past few days or so. Not sure if it is connected to the medication or eating disorder reasons (eating more, school work). She is sleeping well at night. Anxiety is about a 7/10.   Not currently having a period. Did have one in July because of the pill but has stopped right now.   Sexually active right now. Always using condoms.   BMs are fairly regular. She is not needing miralax.   Not currently doing any exercise.   24 hour recall:  B: waffles with syrup and PB S: [employer]  L: Rice over greens with veggies, egg and peanut sauce  S: apple and PB  D: chicken, potatoes and veggies S: 2 rice cakes with PB and jelly  Meal plan is "loose" right now but trying to increase portion size and maybe adding an ensure if possible. Still dealing with feeling full all the time.  No LMP recorded.  Review of Systems  Constitutional: Negative for malaise/fatigue.  Eyes: Negative for double vision.  Respiratory: Negative for shortness of breath.   Cardiovascular: Negative for chest pain and palpitations.  Gastrointestinal: Negative for abdominal pain, constipation, diarrhea, nausea and vomiting.  Genitourinary: Negative for dysuria.  Musculoskeletal: Negative for joint pain and myalgias.  Skin: Negative for rash.  Neurological: Negative for dizziness and headaches.  Endo/Heme/Allergies: Does not  bruise/bleed easily.     Allergies  Allergen Reactions  . Avocado Nausea And Vomiting  . Banana Nausea And Vomiting  . Latex   . Pineapple Itching  . Penicillins Rash   Outpatient Medications Prior to Visit  Medication Sig Dispense Refill  . lactobacillus acidophilus (BACID) TABS tablet Take 2 tablets by mouth 3 (three) times daily.    . Multiple Vitamin (MULTIVITAMIN) tablet Take 1 tablet by mouth daily.    . sertraline (ZOLOFT) 100 MG tablet Take 1 tablet (100 mg total) by mouth daily. 90 tablet 0  . CRYSELLE-28 0.3-30 MG-MCG tablet   0  . OLANZapine (ZYPREXA) 2.5 MG tablet Take 1 tablet (2.5 mg total) by mouth at bedtime. 30 tablet 1  . polyethylene glycol powder (GLYCOLAX/MIRALAX) powder Take 17 g by mouth daily. 578 g 6   No facility-administered medications prior to visit.      Patient Active Problem List   Diagnosis Date Noted  . Moderate malnutrition ([place]) 02/25/2018  . Constipation 08/19/2017  . GAD (generalized anxiety disorder) 08/01/2017  . Eating disorder 06/17/2017  . Osteoporosis 06/17/2017    Past Medical History:  Reviewed and updated?  yes Past Medical History:  Diagnosis Date  . Eating disorder   . Osteoporosis 05/2017    Family History: Reviewed and updated? yes Family History  Problem Relation Age of Onset  . Hyperlipidemia Mother   . Heart disease Paternal Grandfather     The following portions of the patient's history were reviewed and updated as appropriate: allergies, current medications, past family history, past medical history, past social history, past surgical history and problem list.  Visual Observations/Objective:   General Appearance: Thin, well developed, in no apparent distress.  Eyes: conjunctiva no swelling or erythema ENT/Mouth: No hoarseness, No cough for duration of visit.  Neck: Supple  Respiratory: Respiratory effort normal, normal rate, no retractions or distress.   Cardio: Appears well-perfused,  noncyanotic Musculoskeletal: no obvious deformity Skin: visible skin without rashes, ecchymosis, erythema Neuro: Awake and oriented X 3,  Psych:  normal affect, Insight and Judgment appropriate.    Assessment/Plan: 1. Anorexia nervosa, restricting type Will continue sertraline for now. Did not like the way olanzapine or abilify made her feel in the past. Will monitor closely on sertraline.  - Care order/instruction  2. GAD (generalized anxiety disorder) As above. Continues with therapist twice weekly.   3. Moderate malnutrition ([place]) Will get weight and vitals from FNP at [facility] today. Care order sent to her today via fax.   4. Latex allergy We discussed her allergy to avocado, itchiness with pineapple and nausea with bananas. Latex frequently goes along with these food allergies, so have advised her to avoid at this point in the medical setting.   5. Food allergy As above.     I discussed the assessment and treatment plan with the patient and/or parent/guardian.  They were provided an opportunity to ask questions and all were answered.  They agreed with the plan and demonstrated an understanding of the instructions. They were advised to call back  or seek an in-person evaluation in the emergency room if the symptoms worsen or if the condition fails to improve as anticipated.   Follow-up:   1 week via video   Medical decision-making:   I spent 15 minutes on this telehealth visit inclusive of face-to-face video and care coordination time I was located off site during this encounter.   Alfonso Ramus, FNP    CC: Verneda Skill, FNP, Verneda Skill, FNP

## 2019-03-10 ENCOUNTER — Ambulatory Visit (INDEPENDENT_AMBULATORY_CARE_PROVIDER_SITE_OTHER): Payer: BC Managed Care – PPO | Admitting: Pediatrics

## 2019-03-10 VITALS — BP 82/60 | HR 97 | Wt 96.0 lb

## 2019-03-10 DIAGNOSIS — F5001 Anorexia nervosa, restricting type: Secondary | ICD-10-CM | POA: Diagnosis not present

## 2019-03-10 DIAGNOSIS — E440 Moderate protein-calorie malnutrition: Secondary | ICD-10-CM

## 2019-03-10 DIAGNOSIS — F411 Generalized anxiety disorder: Secondary | ICD-10-CM | POA: Diagnosis not present

## 2019-03-10 NOTE — Progress Notes (Signed)
THIS RECORD MAY CONTAIN CONFIDENTIAL INFORMATION THAT SHOULD NOT BE RELEASED WITHOUT REVIEW OF THE SERVICE PROVIDER.  Virtual Follow-Up Visit via Video Note  I connected with Gail Anderson 's patient  on 03/10/19 at  4:00 PM EDT by a video enabled telemedicine application and verified that I am speaking with the correct person using two identifiers.    This patient visit was completed through the use of an audio/video or telephone encounter in the setting of the State of Emergency due to the COVID-19 Pandemic.  I discussed that the purpose of this telehealth visit is to provide medical care while limiting exposure to the novel coronavirus.       I discussed the limitations of evaluation and management by telemedicine and the availability of in person appointments.    The patient expressed understanding and agreed to proceed.   The patient was physically located at home in [place] or a state in which I am permitted to provide care. The patient and/or parent/guardian understood that s/he may incur co-pays and cost sharing, and agreed to the telemedicine visit. The visit was reasonable and appropriate under the circumstances given the patient's presentation at the time.   The patient and/or parent/guardian has been advised of the potential risks and limitations of this mode of treatment (including, but not limited to, the absence of in-person examination) and has agreed to be treated using telemedicine. The patient's/patient's family's questions regarding telemedicine have been answered.    As this visit was completed in an ambulatory virtual setting, the patient and/or parent/guardian has also been advised to contact their provider's office for worsening conditions, and seek emergency medical treatment and/or call 911 if the patient deems either necessary.     Gail Anderson is a 22 y.o. female referred by Verneda Skill, FNP here today for follow-up of anxiety, anorexia,  malnutrition.   Growth Chart Viewed? yes  Previsit planning completed:  yes   History was provided by the patient.  PCP Confirmed?  yes  My Chart Activated?   no    Plan from Last Visit:   Continue zoloft, continue with tx team   Chief Complaint: F/u anorexia and malnutrition   History of Present Illness:  Been a pretty good week. Anxiety has been less which has been good. 6/10.   Feels like intake is "better" and she has been challenging herself more, but she is working on increasing portions with dietitian.   Classes aren't too bad- pretty much all virtual right now.   Denies dizziness on standing.   Family hasn't been super involved but having family session with therapist. Parents are coming this weekend and don't know yet that she has had a relapse. She has tried to keep them sort of at a distance in regards to her treatment because at  Some point in the past she feels like they became too involved.    No LMP recorded.  Review of Systems  Constitutional: Negative for malaise/fatigue and weight loss.  Eyes: Negative for blurred vision.  Respiratory: Negative for shortness of breath.   Cardiovascular: Negative for chest pain and palpitations.  Gastrointestinal: Negative for abdominal pain, constipation, nausea and vomiting.  Genitourinary: Negative for dysuria.  Musculoskeletal: Negative for myalgias.  Neurological: Negative for dizziness and headaches.  Psychiatric/Behavioral: Negative for depression. The patient is nervous/anxious. The patient does not have insomnia.      Allergies  Allergen Reactions  . Avocado Nausea And Vomiting  . Banana Nausea And Vomiting  .  Latex   . Pineapple Itching  . Penicillins Rash   Outpatient Medications Prior to Visit  Medication Sig Dispense Refill  . lactobacillus acidophilus (BACID) TABS tablet Take 2 tablets by mouth 3 (three) times daily.    . Multiple Vitamin (MULTIVITAMIN) tablet Take 1 tablet by mouth daily.    .  sertraline (ZOLOFT) 100 MG tablet Take 1 tablet (100 mg total) by mouth daily. 90 tablet 0   No facility-administered medications prior to visit.      Patient Active Problem List   Diagnosis Date Noted  . Moderate malnutrition (H[facility]) 02/25/2018  . Constipation 08/19/2017  . GAD (generalized anxiety disorder) 08/01/2017  . Eating disorder 06/17/2017  . Osteoporosis 06/17/2017    Past Medical History:  Reviewed and updated?  yes Past Medical History:  Diagnosis Date  . Eating disorder   . Osteoporosis 05/2017    Family History: Reviewed and updated? yes Family History  Problem Relation Age of Onset  . Hyperlipidemia Mother   . Heart disease Paternal Grandfather     The following portions of the patient's history were reviewed and updated as appropriate: allergies, current medications, past family history, past medical history, past social history, past surgical history and problem list.  Wt Readings from Last 3 Encounters:  03/10/19 96 lb (43.5 kg)  02/10/19 95 lb (43.1 kg)  07/06/18 88 lb 9.6 oz (40.2 kg)   Temp Readings from Last 3 Encounters:  08/01/17 98 F (36.7 C) (Oral)  06/17/17 99 F (37.2 C) (Oral)   BP Readings from Last 3 Encounters:  03/10/19 (!) 82/60  02/10/19 (!) 80/57  07/06/18 97/68   Pulse Readings from Last 3 Encounters:  03/10/19 97  02/10/19 72  07/06/18 77     Visual Observations/Objective:   General Appearance: Well nourished well developed, in no apparent distress.  Eyes: conjunctiva no swelling or erythema ENT/Mouth: No hoarseness, No cough for duration of visit.  Neck: Supple  Respiratory: Respiratory effort normal, normal rate, no retractions or distress.   Cardio: Appears well-perfused, noncyanotic Musculoskeletal: no obvious deformity Skin: visible skin without rashes, ecchymosis, erythema Neuro: Awake and oriented X 3,  Psych:  normal affect, Insight and Judgment appropriate.    Assessment/Plan: 1. Moderate malnutrition  (HCC) Continue with tx team. Congratulated her for trying new things this week and encouraged her to continue pushing forward.   2. GAD (generalized anxiety disorder) Continue zoloft, encouraged her to consider adding zyprexa again.   3. Anorexia nervosa, restricting type Has lost many friends and her boyfriend with her eating disorder, so has very little local support. She does have the support of her peers from treatment. She is having a family session with therapist this weekend which I think is very appropriate. Encouraged her to consider if/when she needs to be closer to home for more support to continue recovery. She was in agreement to think about these things. [facility].  She is orthostatic today on vitals, however, reports she is asymptomatic. Weight is unchaged from last week. Urine was fine.     I discussed the assessment and treatment plan with the patient and/or parent/guardian.  They were provided an opportunity to ask questions and all were answered.  They agreed with the plan and demonstrated an understanding of the instructions. They were advised to call back or seek an in-person evaluation in the emergency room if the symptoms worsen or if the condition fails to improve as anticipated.   Follow-up: 3 weeks  via video  Medical decision-making:   I spent 15 minutes on this telehealth visit inclusive of face-to-face video and care coordination time I was located off site during this encounter.   Jonathon Resides, FNP    CC: Trude Mcburney, FNP, Trude Mcburney, FNP

## 2019-04-07 ENCOUNTER — Ambulatory Visit (INDEPENDENT_AMBULATORY_CARE_PROVIDER_SITE_OTHER): Payer: BC Managed Care – PPO | Admitting: Pediatrics

## 2019-04-07 ENCOUNTER — Encounter: Payer: Self-pay | Admitting: Pediatrics

## 2019-04-07 VITALS — BP 80/56 | HR 62 | Wt 94.0 lb

## 2019-04-07 DIAGNOSIS — F411 Generalized anxiety disorder: Secondary | ICD-10-CM | POA: Diagnosis not present

## 2019-04-07 DIAGNOSIS — E440 Moderate protein-calorie malnutrition: Secondary | ICD-10-CM | POA: Diagnosis not present

## 2019-04-07 DIAGNOSIS — F5001 Anorexia nervosa, restricting type: Secondary | ICD-10-CM

## 2019-04-07 NOTE — Progress Notes (Signed)
THIS RECORD MAY CONTAIN CONFIDENTIAL INFORMATION THAT SHOULD NOT BE RELEASED WITHOUT REVIEW OF THE SERVICE PROVIDER.  Virtual Follow-Up Visit via Video Note  I connected with Gail Anderson 's patient  on 04/07/19 at  4:00 PM EST by a video enabled telemedicine application and verified that I am speaking with the correct person using two identifiers.    This patient visit was completed through the use of an audio/video or telephone encounter in the setting of the State of Emergency due to the COVID-19 Pandemic.  I discussed that the purpose of this telehealth visit is to provide medical care while limiting exposure to the novel coronavirus.       I discussed the limitations of evaluation and management by telemedicine and the availability of in person appointments.    The patient expressed understanding and agreed to proceed.   The patient was physically located at home in [place] or a state in which I am permitted to provide care. The patient and/or parent/guardian understood that s/he may incur co-pays and cost sharing, and agreed to the telemedicine visit. The visit was reasonable and appropriate under the circumstances given the patient's presentation at the time.   The patient and/or parent/guardian has been advised of the potential risks and limitations of this mode of treatment (including, but not limited to, the absence of in-person examination) and has agreed to be treated using telemedicine. The patient's/patient's family's questions regarding telemedicine have been answered.    As this visit was completed in an ambulatory virtual setting, the patient and/or parent/guardian has also been advised to contact their provider's office for worsening conditions, and seek emergency medical treatment and/or call 911 if the patient deems either necessary.   Team Care Documentation:  Team care documentation used during this visit? no Team care members present and location: NA   Gail Anderson  is a 23 y.o. female referred by Trude Mcburney, FNP here today for follow-up of anorexia, malnutrition, anxiety.   Growth Chart Viewed? yes  Previsit planning completed:  yes   History was provided by the patient.  PCP Confirmed?  yes  My Chart Activated?   yes    Plan from Last Visit:   Continue with tx team   Chief Complaint: Eating disorder f/u   History of Present Illness:  LMP in July on OCP.  Was on abilify in treatment and had some success with it. Some hesitation to start it again.  Looking at treatment facilities- will likely [facility] back to [facility] if she needs residential treatment.   24 hr recall:  B: waffles pb berries and syrup S: oatmeal  L: salad with sweet potato, egg, dressing, cheese S: apple and pb D: tofu and roasted squash S: yogurt and granola  Continues to work on increasing portion size and density.   School is stressful but she is managing.    No LMP recorded.  Review of Systems  Constitutional: Negative for malaise/fatigue.  Eyes: Negative for double vision.  Respiratory: Negative for shortness of breath.   Cardiovascular: Negative for chest pain and palpitations.  Gastrointestinal: Negative for abdominal pain, constipation, diarrhea, nausea and vomiting.  Genitourinary: Negative for dysuria.  Musculoskeletal: Negative for joint pain and myalgias.  Skin: Negative for rash.  Neurological: Negative for dizziness and headaches.  Endo/Heme/Allergies: Does not bruise/bleed easily.  Psychiatric/Behavioral: Negative for depression. The patient is nervous/anxious and has insomnia.      Allergies  Allergen Reactions  . Avocado Nausea And Vomiting  .  Banana Nausea And Vomiting  . Latex   . Pineapple Itching  . Penicillins Rash   Outpatient Medications Prior to Visit  Medication Sig Dispense Refill  . lactobacillus acidophilus (BACID) TABS tablet Take 2 tablets by mouth 3 (three) times daily.    . Multiple Vitamin (MULTIVITAMIN)  tablet Take 1 tablet by mouth daily.    . sertraline (ZOLOFT) 100 MG tablet Take 1 tablet (100 mg total) by mouth daily. 90 tablet 0   No facility-administered medications prior to visit.      Patient Active Problem List   Diagnosis Date Noted  . Moderate malnutrition (HCC) 02/25/2018  . Constipation 08/19/2017  . GAD (generalized anxiety disorder) 08/01/2017  . Eating disorder 06/17/2017  . Osteoporosis 06/17/2017    Past Medical History:  Reviewed and updated?  yes Past Medical History:  Diagnosis Date  . Eating disorder   . Osteoporosis 05/2017    Family History: Reviewed and updated? yes Family History  Problem Relation Age of Onset  . Hyperlipidemia Mother   . Heart disease Paternal Grandfather     The following portions of the patient's history were reviewed and updated as appropriate: allergies, current medications, past family history, past medical history, past social history, past surgical history and problem list.  Visual Observations/Objective:   General Appearance: Well nourished well developed, in no apparent distress.  Eyes: conjunctiva no swelling or erythema ENT/Mouth: No hoarseness, No cough for duration of visit.  Neck: Supple  Respiratory: Respiratory effort normal, normal rate, no retractions or distress.   Cardio: Appears well-perfused, noncyanotic Musculoskeletal: no obvious deformity Skin: visible skin without rashes, ecchymosis, erythema Neuro: Awake and oriented X 3,  Psych:  normal affect, Insight and Judgment appropriate.   Vitals with BMI 04/07/2019 03/10/2019 03/10/2019  Height - - -  Weight 94 lbs - 96 lbs  BMI - - 15.98  Systolic 84 82 86  Diastolic 56 60 59  Pulse 56 97 61     Assessment/Plan: 1. Moderate malnutrition (HCC) Weight continues to be stagnant. Vitals are stable, but she is likely headed for higher level of care in 3 weeks, likely residential based on how low her weight is. We will get labs when she comes to clinic in  2.5 weeks in anticipation.  2. GAD (generalized anxiety disorder) Continue zoloft 100 mg daily. I recommended adding abilify back at 5 mg. She will consider it and email me if she decides to add back.   3. Anorexia nervosa, restricting type As above. Continues with treatment team. Has good insight, but eating disorder voice is very loud.     I discussed the assessment and treatment plan with the patient and/or parent/guardian.  They were provided an opportunity to ask questions and all were answered.  They agreed with the plan and demonstrated an understanding of the instructions. They were advised to call back or seek an in-person evaluation in the emergency room if the symptoms worsen or if the condition fails to improve as anticipated.   Follow-up:  2.5 weeks. Is seen weekly at student health   Medical decision-making:   I spent 15 minutes on this telehealth visit inclusive of face-to-face video and care coordination time I was located off site during this encounter.   Alfonso Ramus, FNP    CC: Verneda Skill, FNP, Verneda Skill, FNP

## 2019-04-25 ENCOUNTER — Ambulatory Visit: Payer: Self-pay | Admitting: Pediatrics

## 2019-04-26 ENCOUNTER — Ambulatory Visit: Payer: BC Managed Care – PPO | Admitting: Pediatrics

## 2019-04-26 NOTE — Progress Notes (Deleted)
THIS RECORD MAY CONTAIN CONFIDENTIAL INFORMATION THAT SHOULD NOT BE RELEASED WITHOUT REVIEW OF THE SERVICE PROVIDER.  Virtual Follow-Up Visit via Video Note  I connected with Gail Anderson 's {family members:[id number]}  on 04/26/19 at  3:00 PM EST by a video enabled telemedicine application and verified that I am speaking with the correct person using two identifiers.    This patient visit was completed through the use of an audio/video or telephone encounter in the setting of the State of Emergency due to the COVID-19 Pandemic.  I discussed that the purpose of this telehealth visit is to provide medical care while limiting exposure to the novel coronavirus.       I discussed the limitations of evaluation and management by telemedicine and the availability of in person appointments.    The {family members:20773} expressed understanding and agreed to proceed.   The patient was physically located at *** in [place] or a state in which I am permitted to provide care. The patient and/or parent/guardian understood that s/he may incur co-pays and cost sharing, and agreed to the telemedicine visit. The visit was reasonable and appropriate under the circumstances given the patient's presentation at the time.   The patient and/or parent/guardian has been advised of the potential risks and limitations of this mode of treatment (including, but not limited to, the absence of in-person examination) and has agreed to be treated using telemedicine. The patient's/patient's family's questions regarding telemedicine have been answered.    As this visit was completed in an ambulatory virtual setting, the patient and/or parent/guardian has also been advised to contact their provider's office for worsening conditions, and seek emergency medical treatment and/or call 911 if the patient deems either necessary.   Team Care Documentation:  Team care documentation used during this visit? {YES/NO/WILD UQJFH:54562} Team  care members present and location: {Responses; yes/no/unknown/maybe/na:33144}   Gail Anderson is a 23 y.o. female referred by Verneda Skill, FNP here today for follow-up of ***.   Growth Chart Viewed? {YES/NO/NOT APPLICABLE:20182}  Previsit planning completed:  {YES/NO/NOT APPLICABLE:20182}   History was provided by the {CHL AMB PERSONS; PED RELATIVES/OTHER W/PATIENT:[phone]}.  PCP Confirmed?  {YES BW:38937}  My Chart Activated?   {YES [id number]    Plan from Last Visit:   ***  Chief Complaint: ***  History of Present Illness:  ***     No LMP recorded.  ROS:  ***  Allergies  Allergen Reactions  . Avocado Nausea And Vomiting  . Banana Nausea And Vomiting  . Latex   . Pineapple Itching  . Penicillins Rash   Outpatient Medications Prior to Visit  Medication Sig Dispense Refill  . lactobacillus acidophilus (BACID) TABS tablet Take 2 tablets by mouth 3 (three) times daily.    . Multiple Vitamin (MULTIVITAMIN) tablet Take 1 tablet by mouth daily.    . sertraline (ZOLOFT) 100 MG tablet Take 1 tablet (100 mg total) by mouth daily. 90 tablet 0   No facility-administered medications prior to visit.      Patient Active Problem List   Diagnosis Date Noted  . Moderate malnutrition (HCC) 02/25/2018  . Constipation 08/19/2017  . GAD (generalized anxiety disorder) 08/01/2017  . Eating disorder 06/17/2017  . Osteoporosis 06/17/2017    Past Medical History:  Reviewed and updated?  {YES [id number] Past Medical History:  Diagnosis Date  . Eating disorder   . Osteoporosis 05/2017    Family History: Reviewed and updated? {YES [id number] Family History  Problem Relation Age of Onset  .  Hyperlipidemia Mother   . Heart disease Paternal Grandfather     Social History: Lives with:  {Persons; PED relatives w/patient:19415} and describes home situation as *** School: In Grade *** at [employer] Future Plans:  {CHL AMB PED FUTURE PYPPJ:[id number]} Exercise:   {Exercise:23478} Sports:  {Misc; sports:10024} Sleep:  {SX; SLEEP PATTERNS:18802}  Confidentiality was discussed with the patient and if applicable, with caregiver as well.  Patient's personal or confidential phone number: *** Enter confidential phone number in Family Comments section of [employer] Tobacco?  {YES/NO/WILD YKDXI:33825} Drugs/ETOH?  {YES/NO/WILD KNLZJ:67341} Partner preference?  {CHL AMB PARTNER PREFERENCE:[phone]} Sexually Active?  {YES/NO/WILD PFXTK:24097}  Pregnancy Prevention:  {Pregnancy Prevention:[phone]}, reviewed condoms & plan B Trauma currently or in the pastt?  {YES/NO/WILD DZHGD:92426} Suicidal or Self-Harm thoughts?   {YES/NO/WILD STMHD:62229} Guns in the home?  {YES/NO/WILD NLGXQ:11941}  {Common ambulatory SmartLinks:19316}  Visual Observations/Objective:  *** General Appearance: Well nourished well developed, in no apparent distress.  Eyes: conjunctiva no swelling or erythema ENT/Mouth: No hoarseness, No cough for duration of visit.  Neck: Supple  Respiratory: Respiratory effort normal, normal rate, no retractions or distress.   Cardio: Appears well-perfused, noncyanotic Musculoskeletal: no obvious deformity Skin: visible skin without rashes, ecchymosis, erythema Neuro: Awake and oriented X 3,  Psych:  normal affect, Insight and Judgment appropriate.    Assessment/Plan: 1. Moderate malnutrition ([place]) ***    I discussed the assessment and treatment plan with the patient and/or parent/guardian.  They were provided an opportunity to ask questions and all were answered.  They agreed with the plan and demonstrated an understanding of the instructions. They were advised to call back or seek an in-person evaluation in the emergency room if the symptoms worsen or if the condition fails to improve as anticipated.   Follow-up:   ***  Medical decision-making:   I spent *** minutes on this telehealth visit inclusive of face-to-face video and care  coordination time I was located *** during this encounter.   Jonathon Resides, FNP    CC: Trude Mcburney, FNP, Trude Mcburney, FNP

## 2019-04-26 NOTE — Progress Notes (Signed)
NO SHOW.  OPENED IN ERROR

## 2019-05-20 ENCOUNTER — Other Ambulatory Visit: Payer: Self-pay | Admitting: Family

## 2019-05-20 DIAGNOSIS — F5001 Anorexia nervosa, restricting type: Secondary | ICD-10-CM

## 2019-07-04 ENCOUNTER — Other Ambulatory Visit: Payer: Self-pay | Admitting: Pediatrics

## 2019-07-04 DIAGNOSIS — F5001 Anorexia nervosa, restricting type: Secondary | ICD-10-CM

## 2019-07-04 DIAGNOSIS — E440 Moderate protein-calorie malnutrition: Secondary | ICD-10-CM

## 2019-07-26 ENCOUNTER — Other Ambulatory Visit: Payer: Self-pay

## 2019-07-26 ENCOUNTER — Telehealth: Payer: BC Managed Care – PPO | Admitting: Pediatrics

## 2019-08-01 ENCOUNTER — Ambulatory Visit (INDEPENDENT_AMBULATORY_CARE_PROVIDER_SITE_OTHER): Payer: BC Managed Care – PPO | Admitting: Pediatrics

## 2019-08-01 ENCOUNTER — Other Ambulatory Visit: Payer: Self-pay

## 2019-08-01 VITALS — BP 83/60 | HR 71 | Ht 64.57 in | Wt 94.4 lb

## 2019-08-01 DIAGNOSIS — Z1389 Encounter for screening for other disorder: Secondary | ICD-10-CM | POA: Diagnosis not present

## 2019-08-01 DIAGNOSIS — Z111 Encounter for screening for respiratory tuberculosis: Secondary | ICD-10-CM

## 2019-08-01 DIAGNOSIS — E440 Moderate protein-calorie malnutrition: Secondary | ICD-10-CM

## 2019-08-01 DIAGNOSIS — F322 Major depressive disorder, single episode, severe without psychotic features: Secondary | ICD-10-CM | POA: Diagnosis not present

## 2019-08-01 DIAGNOSIS — E559 Vitamin D deficiency, unspecified: Secondary | ICD-10-CM

## 2019-08-01 DIAGNOSIS — F5001 Anorexia nervosa, restricting type: Secondary | ICD-10-CM

## 2019-08-01 DIAGNOSIS — F411 Generalized anxiety disorder: Secondary | ICD-10-CM

## 2019-08-01 DIAGNOSIS — N911 Secondary amenorrhea: Secondary | ICD-10-CM

## 2019-08-01 DIAGNOSIS — M818 Other osteoporosis without current pathological fracture: Secondary | ICD-10-CM

## 2019-08-01 DIAGNOSIS — F50019 Anorexia nervosa, restricting type, unspecified: Secondary | ICD-10-CM

## 2019-08-01 LAB — POCT URINALYSIS DIPSTICK
Bilirubin, UA: NEGATIVE
Blood, UA: NEGATIVE
Glucose, UA: NEGATIVE
Ketones, UA: NEGATIVE
Leukocytes, UA: NEGATIVE
Nitrite, UA: NEGATIVE
Protein, UA: NEGATIVE
Spec Grav, UA: 1.01 (ref 1.010–1.025)
Urobilinogen, UA: NEGATIVE E.U./dL — AB
pH, UA: 6 (ref 5.0–8.0)

## 2019-08-01 MED ORDER — SERTRALINE HCL 100 MG PO TABS: 150.0000 mg | ORAL_TABLET | Freq: Every day | ORAL | 3 refills | Status: DC

## 2019-08-01 MED ORDER — CALCIUM 500 MG PO TABS: 1000.0000 mg | ORAL_TABLET | Freq: Every day | ORAL | 3 refills | Status: DC

## 2019-08-01 MED ORDER — VITAMIN D 50 MCG (2000 UT) PO CAPS: 1.0000 | ORAL_CAPSULE | Freq: Every day | ORAL | 2 refills | Status: AC

## 2019-08-01 NOTE — Progress Notes (Signed)
History was provided by the patient.  Gail Anderson is a 24 y.o. female who is here for anorexia, moderate malnutrition, osteoporosis, secondary amenorrhea.   Verneda Skill, FNP   HPI:  Pt reports that January things started to go downhill an dso pt has been considering going to treatment. Called [facility]- in [place].   No period in about 1 year.   24 hour recall:  B: oatmeal with berries and PB (1 tbsp), water, 1/2 cup S: mini kind bar, string cheese  L: PBJ, handful of popcorn, carrots and hummus  S: clementine and almonds and cashews (small handful)  D: rice (1/2 cup), tofu (small handful), veg and roasted red pepper sauce S: honey bunches of oats (1 cup) and almond milk  Hungry when she went to bed  Coffee 2 cups  Water 64-90 oz   Still on the fence about going to residential. Financial burden would be large.   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 [employer]. Feels like depression is playing a role. Is interested in medication change today.   No LMP recorded.  Review of Systems  Constitutional: Positive for malaise/fatigue.  Eyes: Negative for double vision.  Respiratory: Negative for shortness of breath.   Cardiovascular: Positive for chest pain. Negative for palpitations.  Gastrointestinal: Negative for abdominal pain, constipation, diarrhea, nausea and vomiting.  Genitourinary: Negative for dysuria.  Musculoskeletal: Negative for joint pain and myalgias.  Skin: Negative for rash.  Neurological: Negative for dizziness and headaches.  Endo/Heme/Allergies: Does not bruise/bleed easily.  Psychiatric/Behavioral: Positive for depression. Negative for suicidal ideas. The patient has insomnia. The patient is not nervous/anxious.     Patient Active Problem List   Diagnosis Date Noted  . Moderate malnutrition (HCC) 02/25/2018  . Constipation 08/19/2017  . GAD (generalized anxiety disorder)  08/01/2017  . Eating disorder 06/17/2017  . Osteoporosis 06/17/2017    Current Outpatient Medications on File Prior to Visit  Medication Sig Dispense Refill  . lactobacillus acidophilus (BACID) TABS tablet Take 2 tablets by mouth 3 (three) times daily.    . Multiple Vitamin (MULTIVITAMIN) tablet Take 1 tablet by mouth daily.    . sertraline (ZOLOFT) 100 MG tablet TAKE 1 TABLET BY MOUTH EVERY DAY 90 tablet 0  . [DISCONTINUED] sertraline (ZOLOFT) 100 MG tablet Take 1 tablet (100 mg total) by mouth daily. 90 tablet 0   No current facility-administered medications on file prior to visit.    Allergies  Allergen Reactions  . Avocado Nausea And Vomiting  . Banana Nausea And Vomiting  . Latex   . Pineapple Itching  . Penicillins Rash     Physical Exam:    Vitals:   08/01/19 1508 08/01/19 1515  BP: (!) 86/61 (!) 83/60  Pulse: (!) 56 71  Weight: 94 lb 6.4 oz (42.8 kg)   Height: 5' 4.57" (1.64 m)     Growth percentile SmartLinks can only be used for patients less than 22 years old.  Physical Exam Constitutional:      Appearance: She is well-developed.  HENT:     Head: Normocephalic.  Neck:     Thyroid: No thyromegaly.  Cardiovascular:     Rate and Rhythm: Normal rate and regular rhythm.     Heart sounds: Normal heart sounds.  Pulmonary:     Effort: Pulmonary effort is normal.     Breath sounds: Normal breath sounds.  Abdominal:     General: Bowel  sounds are normal.     Palpations: Abdomen is soft.     Tenderness: There is no abdominal tenderness.  Musculoskeletal:        General: Normal range of motion.  Skin:    General: Skin is warm and dry.  Neurological:     Mental Status: She is alert and oriented to person, place, and time.  Psychiatric:        Mood and Affect: Mood normal.     Assessment/Plan: 1. Moderate malnutrition ([place]) Will get labs and EKG today. Recommended bone density repeat as it has been about 3 years since she has had one and continues to be  amenorrheic. Plans for residential- we will see if she follows through.  - Comprehensive metabolic panel - EKG 58-NIDP - Magnesium - Phosphorus - DG Bone Density; Future - Vitamin D (25 hydroxy) - B12  2. Current severe episode of major depressive disorder without psychotic features without prior episode (HCC) Increase sertraline to 150 mg daily. She does not currently have a therapist because she is meant to go to residential.  - sertraline (ZOLOFT) 100 MG tablet; Take 1.5 tablets (150 mg total) by mouth daily.  Dispense: 45 tablet; Refill: 3  3. GAD (generalized anxiety disorder) As above with sertraline.   4. Other osteoporosis without current pathological fracture Repeat bone density. Needs to be on calcium and vit d. Will also check vit d. To assess need for high dose.  - DG Bone Density; Future - Calcium 500 MG tablet; Take 2 tablets (1,000 mg total) by mouth daily.  Dispense: 60 tablet; Refill: 3 - Cholecalciferol (VITAMIN D) 50 MCG (2000 UT) CAPS; Take 1 capsule (2,000 Units total) by mouth daily.  Dispense: 30 capsule; Refill: 2  5. Anorexia nervosa, restricting type Continues to be significant. Getting enough to sustain daily functioning, but not for weight gain.  - DG Bone Density; Future  6. Secondary amenorrhea Persistent for years. discussed this may have some long term effects on fertility.  7. Screening for tuberculosis For residential.  - QuantiFERON-TB Gold Plus  8. Screening for genitourinary condition WNL.  - POCT urinalysis dipstick  Jonathon Resides, FNP

## 2019-08-01 NOTE — Patient Instructions (Addendum)
Call and schedule EKG [phone]  Bone Density  [address] [place],  [place]  [address]: [phone]  Increase sertraline to 150 mg daily  Start calcium 1000 mg daily  Start vit d 2000 IU daily

## 2019-08-02 LAB — COMPREHENSIVE METABOLIC PANEL
AG Ratio: 1.9 (calc) (ref 1.0–2.5)
ALT: 20 U/L (ref 6–29)
AST: 24 U/L (ref 10–30)
Albumin: 4.9 g/dL (ref 3.6–5.1)
Alkaline phosphatase (APISO): 64 U/L (ref 31–125)
BUN: 13 mg/dL (ref 7–25)
CO2: 27 mmol/L (ref 20–32)
Calcium: 9.8 mg/dL (ref 8.6–10.2)
Chloride: 103 mmol/L (ref 98–110)
Creat: 0.64 mg/dL (ref 0.50–1.10)
Globulin: 2.6 g/dL (calc) (ref 1.9–3.7)
Glucose, Bld: 89 mg/dL (ref 65–99)
Potassium: 4.4 mmol/L (ref 3.5–5.3)
Sodium: 139 mmol/L (ref 135–146)
Total Bilirubin: 0.4 mg/dL (ref 0.2–1.2)
Total Protein: 7.5 g/dL (ref 6.1–8.1)

## 2019-08-02 LAB — MAGNESIUM: Magnesium: 2.2 mg/dL (ref 1.5–2.5)

## 2019-08-02 LAB — EXTRA LAV TOP TUBE

## 2019-08-02 LAB — PHOSPHORUS: Phosphorus: 4.4 mg/dL (ref 2.5–4.5)

## 2019-08-02 LAB — VITAMIN B12: Vitamin B-12: 1069 pg/mL (ref 200–1100)

## 2019-08-02 LAB — VITAMIN D 25 HYDROXY (VIT D DEFICIENCY, FRACTURES): Vit D, 25-Hydroxy: 10 ng/mL — ABNORMAL LOW (ref 30–100)

## 2019-08-02 MED ORDER — VITAMIN D (ERGOCALCIFEROL) 1.25 MG (50000 UNIT) PO CAPS: 50000.0000 [IU] | ORAL_CAPSULE | ORAL | 0 refills | Status: DC

## 2019-08-03 LAB — QUANTIFERON-TB GOLD PLUS
Mitogen-NIL: 9.92 IU/mL
NIL: 0.02 IU/mL
QuantiFERON-TB Gold Plus: NEGATIVE
TB1-NIL: 0 IU/mL
TB2-NIL: 0.01 IU/mL

## 2019-08-04 ENCOUNTER — Ambulatory Visit (HOSPITAL_COMMUNITY)
Admission: RE | Admit: 2019-08-04 | Discharge: 2019-08-04 | Disposition: A | Discharge status: Home / Self Care | Payer: BC Managed Care – PPO | Source: Ambulatory Visit | Attending: Pediatrics | Admitting: Pediatrics

## 2019-08-04 ENCOUNTER — Other Ambulatory Visit: Payer: Self-pay

## 2019-08-04 DIAGNOSIS — E440 Moderate protein-calorie malnutrition: Secondary | ICD-10-CM | POA: Diagnosis present

## 2019-08-29 ENCOUNTER — Ambulatory Visit
Admission: RE | Admit: 2019-08-29 | Discharge: 2019-08-29 | Disposition: A | Discharge status: Home / Self Care | Payer: BC Managed Care – PPO | Source: Ambulatory Visit | Attending: Pediatrics | Admitting: Pediatrics

## 2019-08-29 DIAGNOSIS — M818 Other osteoporosis without current pathological fracture: Secondary | ICD-10-CM | POA: Diagnosis present

## 2019-08-29 DIAGNOSIS — E440 Moderate protein-calorie malnutrition: Secondary | ICD-10-CM | POA: Diagnosis present

## 2019-08-29 DIAGNOSIS — F5001 Anorexia nervosa, restricting type: Secondary | ICD-10-CM | POA: Diagnosis present

## 2019-09-12 ENCOUNTER — Ambulatory Visit (INDEPENDENT_AMBULATORY_CARE_PROVIDER_SITE_OTHER): Payer: BC Managed Care – PPO | Admitting: Pediatrics

## 2019-09-12 ENCOUNTER — Encounter: Payer: Self-pay | Admitting: Pediatrics

## 2019-09-12 ENCOUNTER — Other Ambulatory Visit: Payer: Self-pay

## 2019-09-12 VITALS — BP 95/63 | HR 83 | Ht 64.57 in | Wt 86.4 lb

## 2019-09-12 DIAGNOSIS — Z1389 Encounter for screening for other disorder: Secondary | ICD-10-CM | POA: Diagnosis not present

## 2019-09-12 DIAGNOSIS — F411 Generalized anxiety disorder: Secondary | ICD-10-CM | POA: Diagnosis not present

## 2019-09-12 DIAGNOSIS — F5000 Anorexia nervosa, unspecified: Secondary | ICD-10-CM | POA: Diagnosis not present

## 2019-09-12 DIAGNOSIS — F321 Major depressive disorder, single episode, moderate: Secondary | ICD-10-CM

## 2019-09-12 DIAGNOSIS — N911 Secondary amenorrhea: Secondary | ICD-10-CM

## 2019-09-12 DIAGNOSIS — M818 Other osteoporosis without current pathological fracture: Secondary | ICD-10-CM

## 2019-09-12 DIAGNOSIS — E440 Moderate protein-calorie malnutrition: Secondary | ICD-10-CM | POA: Diagnosis not present

## 2019-09-12 LAB — POCT URINALYSIS DIPSTICK
Bilirubin, UA: NEGATIVE
Blood, UA: NEGATIVE
Glucose, UA: NEGATIVE
Ketones, UA: NEGATIVE
Leukocytes, UA: NEGATIVE
Nitrite, UA: NEGATIVE
Protein, UA: POSITIVE — AB
Spec Grav, UA: 1.015 (ref 1.010–1.025)
Urobilinogen, UA: NEGATIVE E.U./dL — AB
pH, UA: 7 (ref 5.0–8.0)

## 2019-09-12 MED ORDER — OLANZAPINE 5 MG PO TABS: ORAL_TABLET | ORAL | 3 refills | Status: AC

## 2019-09-12 NOTE — Patient Instructions (Addendum)
Call EKG [phone] and schedule ASAP  Start olanzapine at bedtime- 1/2 tablet for 3 nights then increase to whole. Let us know if concerns  Labs today, we will call you with results Lab orders for weekly at student health   Keep working hard!

## 2019-09-12 NOTE — Progress Notes (Signed)
[facility]- [place].  Meeting meal plan about 50%. Plans to add supplement.  Dietitian would like labs weekly. Wants to know if she can get them done at [facility] if possible.   Still figuring out what to do next for school/work, but will be back for one more semester in the fall.   Will add olanzapine for mood and sleep- may help DE voice.  Will continue to follow closely.   Alfonso Ramus, FNP

## 2019-09-12 NOTE — Progress Notes (Signed)
History was provided by the patient.  Gail Anderson is a 24 y.o. female who is here for follow up of anorexia, moderate malnutrition, osteoporosis, and secondary ammenhorrea  PCP confirmed? Yes.    Verneda Skill, FNP  HPI:    Last seen 08/01/19, at that time plan was to increase sertraline to 150 mg daily, plans for residential treatment.  ED voice has been a 9/10. Does not want to do residential treatment, does not want to put off school, did not feel like she got the full extent of care with covid last time. February-April 2020  Takes calcium, vitamin D, allegra, and probiotic. Also sertraline 150 mg daily  Sees a dietician 2x a week based in [place] at [facility], will be starting ensure today. Takes about 50% of meal plan. Dietician requested weekly refeeding labs.   24 hour recall: B: cereal with nuts, almond milk, berries L: rice cake with cream cheese and egg, carrots and hummus S: popcorn and nuts S: yogurt and strawberries Drinks: coffee and water, drinks at least 64 ounces  Menstrual hx: last one was February 2020, was on the pill at time  Mood: not great, really down in general. Not seeing a therapist currently Exercise: walks every now and then Sleep: trouble falling asleep and wakes up in middle of night, takes melatonin which helps sometimes. Takes naps during the day since she is tired, wakes up to look at time and be on phone before bed or in the middle of the night  ROS  No vomiting, diarrhea Has constipation, used to take miralax but has not in a while Dizziness sometimes, no LOC No jt pain or weakness Occasional chest pressure, not pain that will last minutes to hours while at rest  Patient Active Problem List   Diagnosis Date Noted  . Moderate malnutrition (HCC) 02/25/2018  . Constipation 08/19/2017  . GAD (generalized anxiety disorder) 08/01/2017  . Eating disorder 06/17/2017  . Osteoporosis 06/17/2017    Current Outpatient Medications  on File Prior to Visit  Medication Sig Dispense Refill  . Calcium 500 MG tablet Take 2 tablets (1,000 mg total) by mouth daily. 60 tablet 3  . Cholecalciferol (VITAMIN D) 50 MCG (2000 UT) CAPS Take 1 capsule (2,000 Units total) by mouth daily. 30 capsule 2  . lactobacillus acidophilus (BACID) TABS tablet Take 2 tablets by mouth 3 (three) times daily.    . sertraline (ZOLOFT) 100 MG tablet Take 1.5 tablets (150 mg total) by mouth daily. 45 tablet 3  . Vitamin D, Ergocalciferol, (DRISDOL) 1.25 MG (50000 UNIT) CAPS capsule Take 1 capsule (50,000 Units total) by mouth every 7 (seven) days. 12 capsule 0  . Multiple Vitamin (MULTIVITAMIN) tablet Take 1 tablet by mouth daily.    Ethelda Chick Calcium 500 MG TABS Take 2 tablets by mouth daily.    . [DISCONTINUED] sertraline (ZOLOFT) 100 MG tablet Take 1 tablet (100 mg total) by mouth daily. 90 tablet 0   No current facility-administered medications on file prior to visit.    Allergies  Allergen Reactions  . Avocado Nausea And Vomiting  . Banana Nausea And Vomiting  . Latex   . Pineapple Itching  . Penicillins Rash    Physical Exam:    Vitals:   09/12/19 1354  BP: (!) 85/55  Pulse: (!) 57  Weight: 86 lb 6.4 oz (39.2 kg)  Height: 5' 4.57" (1.64 m)    Growth percentile SmartLinks can only be used for patients less than 20  years old. No LMP recorded.  Physical Exam  Gen: thin appearing female, pleasant and talkative HENT: atraumatic, sclera clear, wearing a mask CV: bradycardic, regular rhythm, no murmurs, strong distal pulses Chest: CTAB, no increased WOB Abd: soft, NTND Skin: warm and dry, has scar on right hand Neuro; awake, alert, moves all extremities   Assessment/Plan:  1. Moderate malnutrition ([place]) - will obtain labs (see below), at risk for refeeding syndrome. Continue vitamin D, calcium supplements. Will obtain refeeding labs weekly while seeing dietician and introducing ensure - Amylase - CBC With Differential -  Comprehensive metabolic panel - EKG 16-XIHW - Ferritin - Lipase - Phosphorus - Magnesium - VITAMIN D 25 Hydroxy (Vit-D Deficiency, Fractures) - Prealbumin - Lipid panel - Hemoglobin T8U - Basic Metabolic Panel (BMET); Standing - Magnesium; Standing - Phosphorus; Standing - Phosphorus - Magnesium - Basic Metabolic Panel (BMET)  2. GAD (generalized anxiety disorder) - difficulty sleeping due to anxiety, has loud eating disorder voice, will start olanzapine - OLANZapine (ZYPREXA) 5 MG tablet; Take 1/2 tablet for 3 days. After 3 days, increase to 1 whole tablet  Dispense: 30 tablet; Refill: 3  3. Anorexia nervosa with significantly low body weight - appears to be worsening, losing more weight and has low blood pressure, bradycardic. Does not need to be admitted to hospital, but would likely benefit from residential eating disorder treatment, however not interested in pursuing at this time due to school/covid. Will start olanzapine to help with ED voice, encouraged to continue seeing dietician 2x per week - Amylase - CBC With Differential - Comprehensive metabolic panel - EKG 82-CMKL - Ferritin - Lipase - Phosphorus - Magnesium - VITAMIN D 25 Hydroxy (Vit-D Deficiency, Fractures) - Prealbumin - OLANZapine (ZYPREXA) 5 MG tablet; Take 1/2 tablet for 3 days. After 3 days, increase to 1 whole tablet  Dispense: 30 tablet; Refill: 3  4. Other osteoporosis without current pathological fracture - continue vitamin D and calcium  5. Screening for genitourinary condition - POCT urinalysis dipstick  6. Secondary amenorrhea - continues to have amenorrhea from malnutrition, will obtain labs - LH - Follicle stimulating hormone - Estradiol - Thyroid Panel With TSH - Prolactin  7. Current moderate episode of major depressive disorder without prior episode ([place]) - continue sertraline at 150 mg daily, will add olanzapine - OLANZapine (ZYPREXA) 5 MG tablet; Take 1/2 tablet for 3 days. After  3 days, increase to 1 whole tablet  Dispense: 30 tablet; Refill: [place], MD [facility] PGY3

## 2019-09-13 LAB — COMPREHENSIVE METABOLIC PANEL
AG Ratio: 2.1 (calc) (ref 1.0–2.5)
ALT: 12 U/L (ref 6–29)
AST: 24 U/L (ref 10–30)
Albumin: 5 g/dL (ref 3.6–5.1)
Alkaline phosphatase (APISO): 58 U/L (ref 31–125)
BUN: 9 mg/dL (ref 7–25)
CO2: 29 mmol/L (ref 20–32)
Calcium: 9.7 mg/dL (ref 8.6–10.2)
Chloride: 102 mmol/L (ref 98–110)
Creat: 0.62 mg/dL (ref 0.50–1.10)
Globulin: 2.4 g/dL (calc) (ref 1.9–3.7)
Glucose, Bld: 84 mg/dL (ref 65–99)
Potassium: 4.2 mmol/L (ref 3.5–5.3)
Sodium: 140 mmol/L (ref 135–146)
Total Bilirubin: 0.3 mg/dL (ref 0.2–1.2)
Total Protein: 7.4 g/dL (ref 6.1–8.1)

## 2019-09-13 LAB — CBC WITH DIFFERENTIAL/PLATELET
Absolute Monocytes: 315 cells/uL (ref 200–950)
Basophils Absolute: 59 cells/uL (ref 0–200)
Basophils Relative: 1.4 %
Eosinophils Absolute: 214 cells/uL (ref 15–500)
Eosinophils Relative: 5.1 %
HCT: 44.5 % (ref 35.0–45.0)
Hemoglobin: 14.7 g/dL (ref 11.7–15.5)
Lymphs Abs: 2075 cells/uL (ref 850–3900)
MCH: 30.7 pg (ref 27.0–33.0)
MCHC: 33 g/dL (ref 32.0–36.0)
MCV: 92.9 fL (ref 80.0–100.0)
MPV: 10.8 fL (ref 7.5–12.5)
Monocytes Relative: 7.5 %
Neutro Abs: 1537 cells/uL (ref 1500–7800)
Neutrophils Relative %: 36.6 %
Platelets: 273 10*3/uL (ref 140–400)
RBC: 4.79 10*6/uL (ref 3.80–5.10)
RDW: 12.4 % (ref 11.0–15.0)
Total Lymphocyte: 49.4 %
WBC: 4.2 10*3/uL (ref 3.8–10.8)

## 2019-09-13 LAB — LUTEINIZING HORMONE: LH: 0.7 m[IU]/mL

## 2019-09-13 LAB — THYROID PANEL WITH TSH
Free Thyroxine Index: 1.6 (ref 1.4–3.8)
T3 Uptake: 29 % (ref 22–35)
T4, Total: 5.4 ug/dL (ref 5.1–11.9)
TSH: 1.25 mIU/L

## 2019-09-13 LAB — VITAMIN D 25 HYDROXY (VIT D DEFICIENCY, FRACTURES): Vit D, 25-Hydroxy: 102 ng/mL — ABNORMAL HIGH (ref 30–100)

## 2019-09-13 LAB — PROLACTIN: Prolactin: 5.5 ng/mL

## 2019-09-13 LAB — LIPID PANEL
Cholesterol: 221 mg/dL — ABNORMAL HIGH (ref <200)
HDL: 73 mg/dL (ref >=50)
LDL Cholesterol (Calc): 128 mg/dL (calc) — ABNORMAL HIGH
Non-HDL Cholesterol (Calc): 148 mg/dL (calc) — ABNORMAL HIGH (ref <130)
Total CHOL/HDL Ratio: 3 (calc) (ref <5.0)
Triglycerides: 95 mg/dL (ref <150)

## 2019-09-13 LAB — FERRITIN: Ferritin: 88 ng/mL (ref 16–154)

## 2019-09-13 LAB — MAGNESIUM: Magnesium: 2.3 mg/dL (ref 1.5–2.5)

## 2019-09-13 LAB — FOLLICLE STIMULATING HORMONE: FSH: 6.4 m[IU]/mL

## 2019-09-13 LAB — HEMOGLOBIN A1C
Hgb A1c MFr Bld: 4.9 % of total Hgb (ref <5.7)
Mean Plasma Glucose: 94 (calc)
eAG (mmol/L): 5.2 (calc)

## 2019-09-13 LAB — LIPASE: Lipase: 26 U/L (ref 7–60)

## 2019-09-13 LAB — PHOSPHORUS: Phosphorus: 4 mg/dL (ref 2.5–4.5)

## 2019-09-13 LAB — PREALBUMIN: Prealbumin: 17 mg/dL (ref 17–34)

## 2019-09-13 LAB — ESTRADIOL: Estradiol: <15 pg/mL

## 2019-09-13 LAB — AMYLASE: Amylase: 72 U/L (ref 21–101)

## 2019-09-28 ENCOUNTER — Telehealth: Payer: BC Managed Care – PPO | Admitting: Pediatrics

## 2019-09-29 ENCOUNTER — Ambulatory Visit (INDEPENDENT_AMBULATORY_CARE_PROVIDER_SITE_OTHER): Payer: BC Managed Care – PPO | Admitting: Pediatrics

## 2019-09-29 ENCOUNTER — Other Ambulatory Visit: Payer: Self-pay

## 2019-09-29 VITALS — BP 93/64 | HR 94 | Temp 97.6°F | Ht 64.57 in | Wt 84.2 lb

## 2019-09-29 DIAGNOSIS — F5000 Anorexia nervosa, unspecified: Secondary | ICD-10-CM | POA: Diagnosis not present

## 2019-09-29 DIAGNOSIS — F411 Generalized anxiety disorder: Secondary | ICD-10-CM

## 2019-09-29 DIAGNOSIS — M818 Other osteoporosis without current pathological fracture: Secondary | ICD-10-CM

## 2019-09-29 DIAGNOSIS — F321 Major depressive disorder, single episode, moderate: Secondary | ICD-10-CM | POA: Diagnosis not present

## 2019-09-29 DIAGNOSIS — Z1389 Encounter for screening for other disorder: Secondary | ICD-10-CM | POA: Diagnosis not present

## 2019-09-29 DIAGNOSIS — E43 Unspecified severe protein-calorie malnutrition: Secondary | ICD-10-CM | POA: Diagnosis not present

## 2019-09-29 LAB — POCT URINALYSIS DIPSTICK
Bilirubin, UA: NEGATIVE
Blood, UA: NEGATIVE
Glucose, UA: NEGATIVE
Ketones, UA: NEGATIVE
Leukocytes, UA: NEGATIVE
Nitrite, UA: NEGATIVE
Protein, UA: POSITIVE — AB
Spec Grav, UA: 1.015 (ref 1.010–1.025)
Urobilinogen, UA: 1 E.U./dL
pH, UA: 6.5 (ref 5.0–8.0)

## 2019-09-29 NOTE — Patient Instructions (Addendum)
Please call and schedule EKG [phone] Labs today

## 2019-09-29 NOTE — Progress Notes (Signed)
History was provided by the patient.  Gail Anderson is a 24 y.o. female who is here for severe malnutrition, anorexia, secondary amenorrhea, osteoporosis.   Trude Mcburney, FNP   HPI:  Pt reports that she is going back on exchanges which she hasn't done in a few years. She started more structured this week. Has been meeting about 75%. She is not using any ensure or boost.   Depressive sx are pretty bad. Started the olanzapine a few days ago. Has been more tired, but hard to figure out what to attribute it to. Denies SI.   Still taking melatonin every night. Still waking up frequently.   Has not talked to her parents about how bad things are. 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 She has run through scenarios in her head, and unable to see how she will commit to residential treatment at this time.   PHQ-SADS Last 3 Score only 09/29/2019 08/19/2017 08/01/2017  PHQ-15 Score 9 13 -  Total GAD-7 Score 9 14 15   Score 18 5 0     No LMP recorded.  Review of Systems  Constitutional: Positive for malaise/fatigue.  Eyes: Negative for double vision.  Respiratory: Negative for shortness of breath.   Cardiovascular: Negative for chest pain and palpitations.  Gastrointestinal: Positive for abdominal pain. Negative for constipation, diarrhea, nausea and vomiting.  Genitourinary: Negative for dysuria.  Musculoskeletal: Negative for joint pain and myalgias.  Skin: Negative for rash.  Neurological: Positive for headaches. Negative for dizziness.  Endo/Heme/Allergies: Does not bruise/bleed easily.  Psychiatric/Behavioral: Positive for depression. The patient is nervous/anxious and has insomnia.     Patient Active Problem List   Diagnosis Date Noted  . Anorexia nervosa with dangerously low body weight 09/29/2019  . Moderate malnutrition ([place]) 02/25/2018  . Constipation 08/19/2017  . GAD (generalized anxiety disorder) 08/01/2017  . Osteoporosis 06/17/2017     Current Outpatient Medications on File Prior to Visit  Medication Sig Dispense Refill  . Calcium 500 MG tablet Take 2 tablets (1,000 mg total) by mouth daily. 60 tablet 3  . Cholecalciferol (VITAMIN D) 50 MCG (2000 UT) CAPS Take 1 capsule (2,000 Units total) by mouth daily. 30 capsule 2  . lactobacillus acidophilus (BACID) TABS tablet Take 2 tablets by mouth 3 (three) times daily.    . Multiple Vitamin (MULTIVITAMIN) tablet Take 1 tablet by mouth daily.    Marland Kitchen OLANZapine (ZYPREXA) 5 MG tablet Take 1/2 tablet for 3 days. After 3 days, increase to 1 whole tablet 30 tablet 3  . Oyster [place] Calcium 500 MG TABS Take 2 tablets by mouth daily.    . sertraline (ZOLOFT) 100 MG tablet Take 1.5 tablets (150 mg total) by mouth daily. 45 tablet 3  . Vitamin D, Ergocalciferol, (DRISDOL) 1.25 MG (50000 UNIT) CAPS capsule Take 1 capsule (50,000 Units total) by mouth every 7 (seven) days. 12 capsule 0  . [DISCONTINUED] sertraline (ZOLOFT) 100 MG tablet Take 1 tablet (100 mg total) by mouth daily. 90 tablet 0   No current facility-administered medications on file prior to visit.    Allergies  Allergen Reactions  . Avocado Nausea And Vomiting  . Banana Nausea And Vomiting  . Latex   . Pineapple Itching  . Penicillins Rash    Physical Exam:    Vitals:   09/29/19 0841 09/29/19 0852  BP: (!) 85/57 93/64  Pulse: 63 94  Weight: 84 lb 3.2 oz (38.2 kg)   Height: 5' 4.57" (1.64  m)     Growth percentile SmartLinks can only be used for patients less than 14 years old.  Physical Exam Vitals and nursing note reviewed.  Constitutional:      General: She is not in acute distress.    Appearance: She is well-developed.  Neck:     Thyroid: No thyromegaly.  Cardiovascular:     Rate and Rhythm: Regular rhythm. Bradycardia present.     Heart sounds: Heart sounds are distant. No murmur.  Pulmonary:     Breath sounds: Normal breath sounds.  Abdominal:     Palpations: Abdomen is soft. There is no mass.      Tenderness: There is no abdominal tenderness. There is no guarding.  Musculoskeletal:     Right lower leg: No edema.     Left lower leg: No edema.  Lymphadenopathy:     Cervical: No cervical adenopathy.  Skin:    General: Skin is cool.     Capillary Refill: Capillary refill takes 2 to 3 seconds.     Findings: No rash.  Neurological:     Mental Status: She is alert.     Comments: No tremor     Assessment/Plan: 1. Severe malnutrition (HCC) Repeat labs today. I discussed again the severity of her situation with her to include her risk of death from not seeking treatment. Discussed that if her BMI continues to fall, I will need to hospitalize her for further care. Her risk of refeeding syndrome if she truly complies with her meal plan is high. I will communicate this with the dietitian who is now seeing her virtually. Discussed that if she continues to lose weight at next visit, it will be absolutely necessary for me to contact her parents.  - Basic Metabolic Panel (BMET) - Magnesium - Phosphorus - EKG 12-Lead  2. GAD (generalized anxiety disorder) Persistent- olanzapine finally started this week. Likely related to malnutrition.   3. Anorexia nervosa with dangerously low body weight As above.   4. Other osteoporosis without current pathological fracture Will continue to worsen with lack of estrogen.   5. Major Depressive Disorder Continues to be severe. Related to malnutrition. Medication may help, but she may be so malnourished that the efficacy is poor. Currently without therapist as local tx team felt it was unethical to keep seeing her at this weight.   6. Screening for genitourinary condition +protein, consistent with malnutrition.  - POCT Urinalysis Dipstick  1 week f/u in clinic.   Alfonso Ramus, FNP

## 2019-09-30 LAB — BASIC METABOLIC PANEL
BUN: 10 mg/dL (ref 7–25)
CO2: 32 mmol/L (ref 20–32)
Calcium: 10 mg/dL (ref 8.6–10.2)
Chloride: 102 mmol/L (ref 98–110)
Creat: 0.69 mg/dL (ref 0.50–1.10)
Glucose, Bld: 49 mg/dL — ABNORMAL LOW (ref 65–99)
Potassium: 4.1 mmol/L (ref 3.5–5.3)
Sodium: 141 mmol/L (ref 135–146)

## 2019-09-30 LAB — MAGNESIUM: Magnesium: 2.3 mg/dL (ref 1.5–2.5)

## 2019-09-30 LAB — PHOSPHORUS: Phosphorus: 4 mg/dL (ref 2.5–4.5)

## 2019-10-07 DIAGNOSIS — F322 Major depressive disorder, single episode, severe without psychotic features: Secondary | ICD-10-CM

## 2019-10-08 MED ORDER — SERTRALINE HCL 100 MG PO TABS: 150.0000 mg | ORAL_TABLET | Freq: Every day | ORAL | 3 refills | Status: DC

## 2019-10-10 ENCOUNTER — Ambulatory Visit (INDEPENDENT_AMBULATORY_CARE_PROVIDER_SITE_OTHER): Payer: BC Managed Care – PPO | Admitting: Pediatrics

## 2019-10-10 ENCOUNTER — Other Ambulatory Visit: Payer: Self-pay

## 2019-10-10 ENCOUNTER — Encounter: Payer: Self-pay | Admitting: Pediatrics

## 2019-10-10 VITALS — BP 97/65 | HR 80 | Temp 98.1°F | Ht 64.57 in | Wt 84.8 lb

## 2019-10-10 DIAGNOSIS — F411 Generalized anxiety disorder: Secondary | ICD-10-CM | POA: Diagnosis not present

## 2019-10-10 DIAGNOSIS — F5000 Anorexia nervosa, unspecified: Secondary | ICD-10-CM | POA: Diagnosis not present

## 2019-10-10 DIAGNOSIS — Z1389 Encounter for screening for other disorder: Secondary | ICD-10-CM | POA: Diagnosis not present

## 2019-10-10 DIAGNOSIS — E162 Hypoglycemia, unspecified: Secondary | ICD-10-CM

## 2019-10-10 DIAGNOSIS — F322 Major depressive disorder, single episode, severe without psychotic features: Secondary | ICD-10-CM | POA: Diagnosis not present

## 2019-10-10 DIAGNOSIS — E43 Unspecified severe protein-calorie malnutrition: Secondary | ICD-10-CM | POA: Diagnosis not present

## 2019-10-10 LAB — POCT URINALYSIS DIPSTICK
Bilirubin, UA: NEGATIVE
Blood, UA: NEGATIVE
Glucose, UA: NEGATIVE
Ketones, UA: NEGATIVE
Leukocytes, UA: NEGATIVE
Nitrite, UA: NEGATIVE
Protein, UA: POSITIVE — AB
Spec Grav, UA: <=1.005 — AB (ref 1.010–1.025)
Urobilinogen, UA: 0.2 E.U./dL
pH, UA: 6 (ref 5.0–8.0)

## 2019-10-10 LAB — BASIC METABOLIC PANEL
BUN: 11 mg/dL (ref 7–25)
CO2: 29 mmol/L (ref 20–32)
Calcium: 10 mg/dL (ref 8.6–10.2)
Chloride: 104 mmol/L (ref 98–110)
Creat: 0.65 mg/dL (ref 0.50–1.10)
Glucose, Bld: 84 mg/dL (ref 65–99)
Potassium: 4.1 mmol/L (ref 3.5–5.3)
Sodium: 142 mmol/L (ref 135–146)

## 2019-10-10 LAB — POCT GLUCOSE (DEVICE FOR HOME USE): POC Glucose: 83 mg/dl (ref 70–99)

## 2019-10-10 LAB — PHOSPHORUS: Phosphorus: 4 mg/dL (ref 2.5–4.5)

## 2019-10-10 LAB — MAGNESIUM: Magnesium: 2.1 mg/dL (ref 1.5–2.5)

## 2019-10-10 NOTE — Progress Notes (Signed)
History was provided by the patient.  Gail Anderson is a 24 y.o. female who is here for anorexia, malnutrition, GAD, MDD.  No primary care provider on file.   HPI:  Pt reports the past few days has been really "out of it," tired. Feels really off. Slept most of the weekend. Left apartment once and felt unwell.   Started olanzapine a few days before she started seeing me last.   + dizziness, headache, HR on apple watch was 48 the other day.   Finding it hard to get up in the AM, sleeping a lot throughout the day.   Says that she is completing the meal plan from her dietitian; is drinking one ensure every PM.   Planning to sign lease today to live in [place] through the summer.   24 h recall:   Oatmeal (1/2 cup dry) with berries, coconut, pb and soy latte luna bar  Lunch- english muffin pb, yogurt (3/4 cup chobani plain nonfat) and grapes Carrots, wheat thins (handful) and hummus  Salad with dressing, sweet potato, cheese and egg (1 egg)  Light ice cream with almonds and ensure  Has not talked to family about current severity of her eating disorder. She plans to see them at the end of the month to move to her new apartment and thinks that perhaps she will say something about how bad she looks.   She has no local support and relies on her old treatment friends for support; one in particular who is also in a relapse. She feels they are both working hard to challenge each other and keep each other accountable.   When the topic of medical stabilization for refeeding was discussed, she reports she did not come prepared for any sort of admission today, she feels she "knows her body" and thinks she is not sick enough for this. She says if she were to be sicker, she would seek medical care. She is unable how to say she would feel worse and thus know she needed further care.    No LMP recorded.  Review of Systems  Constitutional: Negative for malaise/fatigue.  Eyes: Negative for double  vision.  Respiratory: Negative for shortness of breath.   Cardiovascular: Negative for chest pain and palpitations.  Gastrointestinal: Negative for abdominal pain, constipation, diarrhea, nausea and vomiting.  Genitourinary: Negative for dysuria.  Musculoskeletal: Negative for joint pain and myalgias.  Skin: Negative for rash.  Neurological: Negative for dizziness and headaches.  Endo/Heme/Allergies: Does not bruise/bleed easily.    Patient Active Problem List   Diagnosis Date Noted  . Current severe episode of major depressive disorder without psychotic features without prior episode ([place]) 10/10/2019  . Severe malnutrition ([place]) 10/10/2019  . Anorexia nervosa with dangerously low body weight 09/29/2019  . Constipation 08/19/2017  . GAD (generalized anxiety disorder) 08/01/2017  . Osteoporosis 06/17/2017    Current Outpatient Medications on File Prior to Visit  Medication Sig Dispense Refill  . Calcium 500 MG tablet Take 2 tablets (1,000 mg total) by mouth daily. 60 tablet 3  . Cholecalciferol (VITAMIN D) 50 MCG (2000 UT) CAPS Take 1 capsule (2,000 Units total) by mouth daily. 30 capsule 2  . lactobacillus acidophilus (BACID) TABS tablet Take 2 tablets by mouth 3 (three) times daily.    . Multiple Vitamin (MULTIVITAMIN) tablet Take 1 tablet by mouth daily.    Marland Kitchen OLANZapine (ZYPREXA) 5 MG tablet Take 1/2 tablet for 3 days. After 3 days, increase to 1 whole tablet 30 tablet  3  . Oyster Shell Calcium 500 MG TABS Take 2 tablets by mouth daily.    . sertraline (ZOLOFT) 100 MG tablet Take 1.5 tablets (150 mg total) by mouth daily. 45 tablet 3  . Vitamin D, Ergocalciferol, (DRISDOL) 1.25 MG (50000 UNIT) CAPS capsule Take 1 capsule (50,000 Units total) by mouth every 7 (seven) days. (Patient not taking: Reported on 10/10/2019) 12 capsule 0  . [DISCONTINUED] sertraline (ZOLOFT) 100 MG tablet Take 1 tablet (100 mg total) by mouth daily. 90 tablet 0   No current facility-administered medications  on file prior to visit.    Allergies  Allergen Reactions  . Avocado Nausea And Vomiting  . Banana Nausea And Vomiting  . Latex   . Pineapple Itching  . Penicillins Rash     Physical Exam:    Vitals:   10/10/19 1434 10/10/19 1528 10/10/19 1529  BP: (!) 84/53 (!) 91/56 97/65  Pulse: 70 (!) 59 80  Weight: 84 lb 12.8 oz (38.5 kg)    Height: 5' 4.57" (1.64 m)      Growth percentile SmartLinks can only be used for patients less than 33 years old.  Physical Exam Vitals and nursing note reviewed.  Constitutional:      General: She is not in acute distress.    Appearance: She is cachectic.  Neck:     Thyroid: No thyromegaly.  Cardiovascular:     Rate and Rhythm: Normal rate and regular rhythm.     Heart sounds: No murmur.  Pulmonary:     Breath sounds: Normal breath sounds.  Abdominal:     Palpations: Abdomen is soft. There is no mass.     Tenderness: There is no abdominal tenderness. There is no guarding.  Musculoskeletal:     Right lower leg: No edema.     Left lower leg: No edema.  Lymphadenopathy:     Cervical: No cervical adenopathy.  Skin:    General: Skin is warm.     Coloration: Skin is pale.     Findings: No rash.  Neurological:     Mental Status: She is alert.     Comments: No tremor     Assessment/Plan: 1. Severe malnutrition (HCC) Will get basic labs today and see her again on Thursday for vitals and labs. I discussed again that at her low BMI and with how she is feeling, she is at risk for refeeding syndrome, arrhythmia, hypoglycemia and death. She acknowledges these risks and declined admission to [facility] or [facility] for medical stabilization today.  - Basic Metabolic Panel (BMET) - Magnesium - Phosphorus  2. Current severe episode of major depressive disorder without psychotic features without prior episode (HCC) Continue sertraline and olanzapine daily. I don't think that the olanzapine is causing her symptoms 10 days in at this point, which we  discussed.   3. Anorexia nervosa with dangerously low body weight As above.   4. GAD (generalized anxiety disorder) As above. It is unlikely her medications are serving her very well at this point given how undernourished she is.   5. Hypoglycemia 49 on last labs- checked here today- was 83. Last ate around 1 pm.  - POCT Glucose (Device for Home Use)  6. Screening for genitourinary condition Very dilute urine. 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 Denies waterloading.  - POCT urinalysis dipstick  Alfonso Ramus, FNP

## 2019-10-12 ENCOUNTER — Other Ambulatory Visit: Payer: Self-pay | Admitting: Pediatrics

## 2019-10-12 DIAGNOSIS — F411 Generalized anxiety disorder: Secondary | ICD-10-CM

## 2019-10-12 DIAGNOSIS — F321 Major depressive disorder, single episode, moderate: Secondary | ICD-10-CM

## 2019-10-12 DIAGNOSIS — F5000 Anorexia nervosa, unspecified: Secondary | ICD-10-CM

## 2019-10-13 ENCOUNTER — Ambulatory Visit (INDEPENDENT_AMBULATORY_CARE_PROVIDER_SITE_OTHER): Payer: BC Managed Care – PPO | Admitting: Pediatrics

## 2019-10-13 ENCOUNTER — Other Ambulatory Visit: Payer: Self-pay

## 2019-10-13 ENCOUNTER — Encounter: Payer: Self-pay | Admitting: Pediatrics

## 2019-10-13 VITALS — BP 97/68 | HR 70 | Ht 64.76 in | Wt 84.8 lb

## 2019-10-13 DIAGNOSIS — F411 Generalized anxiety disorder: Secondary | ICD-10-CM | POA: Diagnosis not present

## 2019-10-13 DIAGNOSIS — F5000 Anorexia nervosa, unspecified: Secondary | ICD-10-CM | POA: Diagnosis not present

## 2019-10-13 DIAGNOSIS — F322 Major depressive disorder, single episode, severe without psychotic features: Secondary | ICD-10-CM | POA: Diagnosis not present

## 2019-10-13 DIAGNOSIS — I959 Hypotension, unspecified: Secondary | ICD-10-CM

## 2019-10-13 DIAGNOSIS — E43 Unspecified severe protein-calorie malnutrition: Secondary | ICD-10-CM

## 2019-10-13 DIAGNOSIS — Z1389 Encounter for screening for other disorder: Secondary | ICD-10-CM | POA: Diagnosis not present

## 2019-10-13 LAB — POCT URINALYSIS DIPSTICK
Bilirubin, UA: NEGATIVE
Blood, UA: NEGATIVE
Glucose, UA: NEGATIVE
Ketones, UA: NEGATIVE
Leukocytes, UA: NEGATIVE
Nitrite, UA: NEGATIVE
Protein, UA: NEGATIVE
Spec Grav, UA: <=1.005 — AB (ref 1.010–1.025)
Urobilinogen, UA: NEGATIVE E.U./dL — AB
pH, UA: 7 (ref 5.0–8.0)

## 2019-10-13 NOTE — Progress Notes (Signed)
History was provided by the patient.  Gail Anderson is a 24 y.o. female who is here for severe malnutrition, anorexia, mdd, gad.  No primary care provider on file.   HPI:  Pt reports that she saw Domingo Dimes this morning and discussing appt from Monday. She wants Zemira to look into treatment facilities and get on waitlists.   She reports she is ok with reaching out and exploring. Feels she is very stubborn and this is a big barrier to deciding to do something.   Reports she wants to tell parents how severely ill she is as she doesn't want to shock them with it coming from [place].   + dizziness on standing, about the same. Ongoing headaches and difficulty sleeping. Gets very hungry in the middle of the night and usually does eat, but then a lot of guilt the next day.   No LMP recorded.  Review of Systems  Constitutional: Positive for malaise/fatigue.  Eyes: Negative for double vision.  Respiratory: Negative for shortness of breath.   Cardiovascular: Negative for chest pain and palpitations.  Gastrointestinal: Negative for abdominal pain, constipation, diarrhea, nausea and vomiting.  Genitourinary: Negative for dysuria.  Musculoskeletal: Negative for joint pain and myalgias.  Skin: Negative for rash.  Neurological: Negative for dizziness and headaches.  Endo/Heme/Allergies: Does not bruise/bleed easily.    Patient Active Problem List   Diagnosis Date Noted  . Current severe episode of major depressive disorder without psychotic features without prior episode (HCC) 10/10/2019  . Severe malnutrition (HCC) 10/10/2019  . Anorexia nervosa with dangerously low body weight 09/29/2019  . Constipation 08/19/2017  . GAD (generalized anxiety disorder) 08/01/2017  . Osteoporosis 06/17/2017    Current Outpatient Medications on File Prior to Visit  Medication Sig Dispense Refill  . Calcium 500 MG tablet Take 2 tablets (1,000 mg total) by mouth daily. 60 tablet 3  . lactobacillus acidophilus (BACID)  TABS tablet Take 2 tablets by mouth 3 (three) times daily.    . Multiple Vitamin (MULTIVITAMIN) tablet Take 1 tablet by mouth daily.    Marland Kitchen OLANZapine (ZYPREXA) 5 MG tablet Take 1/2 tablet for 3 days. After 3 days, increase to 1 whole tablet 30 tablet 3  . Oyster Shell Calcium 500 MG TABS Take 2 tablets by mouth daily.    . sertraline (ZOLOFT) 100 MG tablet Take 1.5 tablets (150 mg total) by mouth daily. 45 tablet 3  . Cholecalciferol (VITAMIN D) 50 MCG (2000 UT) CAPS Take 1 capsule (2,000 Units total) by mouth daily. (Patient not taking: Reported on 10/13/2019) 30 capsule 2  . Vitamin D, Ergocalciferol, (DRISDOL) 1.25 MG (50000 UNIT) CAPS capsule Take 1 capsule (50,000 Units total) by mouth every 7 (seven) days. (Patient not taking: Reported on 10/10/2019) 12 capsule 0  . [DISCONTINUED] sertraline (ZOLOFT) 100 MG tablet Take 1 tablet (100 mg total) by mouth daily. 90 tablet 0   No current facility-administered medications on file prior to visit.    Allergies  Allergen Reactions  . Avocado Nausea And Vomiting  . Banana Nausea And Vomiting  . Latex   . Pineapple Itching  . Penicillins Rash    Physical Exam:    Vitals:   10/13/19 1143  BP: (!) 82/54  Pulse: (!) 57  Weight: 84 lb 12.8 oz (38.5 kg)  Height: 5' 4.76" (1.645 m)    Growth percentile SmartLinks can only be used for patients less than 69 years old.  Physical Exam Vitals and nursing note reviewed.  Constitutional:  General: She is not in acute distress.    Appearance: She is well-developed.  Neck:     Thyroid: No thyromegaly.  Cardiovascular:     Rate and Rhythm: Normal rate and regular rhythm.     Heart sounds: No murmur.  Pulmonary:     Breath sounds: Normal breath sounds.  Abdominal:     Palpations: Abdomen is soft. There is no mass.     Tenderness: There is no abdominal tenderness. There is no guarding.  Musculoskeletal:     Right lower leg: No edema.     Left lower leg: No edema.  Lymphadenopathy:      Cervical: No cervical adenopathy.  Skin:    General: Skin is warm.     Capillary Refill: Capillary refill takes 2 to 3 seconds.     Coloration: Skin is pale.     Findings: No rash.  Neurological:     Mental Status: She is alert.     Comments: No tremor     Assessment/Plan: 1. Severe malnutrition ([place]) No weight gain since earlier in the week. Continues to ultimately meet criteria for hospitalization, be continues to decline. Will repeat labs and EKG next week.   2. Current severe episode of major depressive disorder without psychotic features without prior episode ([place]) Continue sertraline and olanzapine.   3. GAD (generalized anxiety disorder) As above.   4. Anorexia nervosa with dangerously low body weight Sleep continues to be a problem, but I am not willing to add anything that risks causing her body to miss nocturnal hypoglycemia. Again discussed risks today of where she is. She agrees to tell her parents and reach out to higher level of care this week.   5. Hypotension, unspecified hypotension type BP continues to be quite low while lying but is not orthostatic today.   6. Screening for genitourinary condition Seems to continue to diurese which is likely the effect on her BP consistent with HPA dysfunction - POCT urinalysis dipstick  Jonathon Resides, FNP

## 2019-10-19 ENCOUNTER — Other Ambulatory Visit: Payer: Self-pay | Admitting: Pediatrics

## 2019-10-19 DIAGNOSIS — F322 Major depressive disorder, single episode, severe without psychotic features: Secondary | ICD-10-CM

## 2019-10-20 ENCOUNTER — Encounter: Payer: Self-pay | Admitting: Pediatrics

## 2019-10-20 ENCOUNTER — Ambulatory Visit (INDEPENDENT_AMBULATORY_CARE_PROVIDER_SITE_OTHER): Payer: BC Managed Care – PPO | Admitting: Pediatrics

## 2019-10-20 ENCOUNTER — Other Ambulatory Visit: Payer: Self-pay

## 2019-10-20 VITALS — BP 98/66 | HR 82 | Ht 64.57 in | Wt 85.0 lb

## 2019-10-20 DIAGNOSIS — F322 Major depressive disorder, single episode, severe without psychotic features: Secondary | ICD-10-CM | POA: Diagnosis not present

## 2019-10-20 DIAGNOSIS — F411 Generalized anxiety disorder: Secondary | ICD-10-CM | POA: Diagnosis not present

## 2019-10-20 DIAGNOSIS — F5000 Anorexia nervosa, unspecified: Secondary | ICD-10-CM

## 2019-10-20 DIAGNOSIS — E43 Unspecified severe protein-calorie malnutrition: Secondary | ICD-10-CM | POA: Diagnosis not present

## 2019-10-20 DIAGNOSIS — Z1389 Encounter for screening for other disorder: Secondary | ICD-10-CM

## 2019-10-20 DIAGNOSIS — I959 Hypotension, unspecified: Secondary | ICD-10-CM

## 2019-10-20 LAB — POCT URINALYSIS DIPSTICK
Bilirubin, UA: NEGATIVE
Blood, UA: NEGATIVE
Glucose, UA: NEGATIVE
Ketones, UA: NEGATIVE
Leukocytes, UA: NEGATIVE
Nitrite, UA: NEGATIVE
Protein, UA: NEGATIVE
Spec Grav, UA: 1.015 (ref 1.010–1.025)
Urobilinogen, UA: NEGATIVE E.U./dL — AB
pH, UA: 7 (ref 5.0–8.0)

## 2019-10-20 NOTE — Progress Notes (Signed)
History was provided by the patient.  Gail Anderson is a 24 y.o. female who is here for anorexia, severe malnutrition, gad, mdd.  No primary care provider on file.   HPI:  Pt reports that she thinks she has bee neating better. Over the weekend she was really hungry and hunger cues have stopped over the last few days, so hard to keep momentum going. Feeding hunger cues happened, was scary,   Family still doesn't know how sick she is. She is reluctant to tell them because once she does, they will want her to go to treatment.   Does feel like her ED has taken everything from her- has not been weight restored since things started at 17.   She has looked into programs- she has a call with [facility] on Monday. Just had [employer] call her back.   No LMP recorded.  Review of Systems  Constitutional: Negative for malaise/fatigue.  Eyes: Negative for double vision.  Respiratory: Positive for shortness of breath.   Cardiovascular: Positive for chest pain. Negative for palpitations.  Gastrointestinal: Negative for abdominal pain, constipation, diarrhea, nausea and vomiting.  Genitourinary: Negative for dysuria.  Musculoskeletal: Negative for joint pain and myalgias.  Skin: Negative for rash.  Neurological: Positive for dizziness. Negative for headaches.  Endo/Heme/Allergies: Does not bruise/bleed easily.    Patient Active Problem List   Diagnosis Date Noted  . Hypotension 10/13/2019  . Current severe episode of major depressive disorder without psychotic features without prior episode (HCC) 10/10/2019  . Severe malnutrition (HCC) 10/10/2019  . Anorexia nervosa with dangerously low body weight 09/29/2019  . Constipation 08/19/2017  . GAD (generalized anxiety disorder) 08/01/2017  . Osteoporosis 06/17/2017    Current Outpatient Medications on File Prior to Visit  Medication Sig Dispense Refill  . Calcium 500 MG tablet Take 2 tablets (1,000 mg total) by mouth daily. 60 tablet 3  . lactobacillus  acidophilus (BACID) TABS tablet Take 2 tablets by mouth 3 (three) times daily.    . Multiple Vitamin (MULTIVITAMIN) tablet Take 1 tablet by mouth daily.    Marland Kitchen OLANZapine (ZYPREXA) 5 MG tablet Take 1/2 tablet for 3 days. After 3 days, increase to 1 whole tablet 30 tablet 3  . Oyster Shell Calcium 500 MG TABS Take 2 tablets by mouth daily.    . sertraline (ZOLOFT) 100 MG tablet TAKE 1 TABLET BY MOUTH EVERY DAY 90 tablet 0  . Cholecalciferol (VITAMIN D) 50 MCG (2000 UT) CAPS Take 1 capsule (2,000 Units total) by mouth daily. (Patient not taking: Reported on 10/13/2019) 30 capsule 2  . Vitamin D, Ergocalciferol, (DRISDOL) 1.25 MG (50000 UNIT) CAPS capsule Take 1 capsule (50,000 Units total) by mouth every 7 (seven) days. (Patient not taking: Reported on 10/10/2019) 12 capsule 0  . [DISCONTINUED] sertraline (ZOLOFT) 100 MG tablet Take 1 tablet (100 mg total) by mouth daily. 90 tablet 0  . [DISCONTINUED] sertraline (ZOLOFT) 100 MG tablet Take 1.5 tablets (150 mg total) by mouth daily. 45 tablet 3   No current facility-administered medications on file prior to visit.    Allergies  Allergen Reactions  . Avocado Nausea And Vomiting  . Banana Nausea And Vomiting  . Latex   . Pineapple Itching  . Penicillins Rash    Physical Exam:    Vitals:   10/20/19 1146  BP: (!) 93/59  Pulse: 61  Weight: 85 lb (38.6 kg)  Height: 5' 4.57" (1.64 m)    Growth percentile SmartLinks can only be used for patients  less than 37 years old.  Physical Exam Vitals and nursing note reviewed.  Constitutional:      General: She is not in acute distress.    Appearance: She is well-developed.  Neck:     Thyroid: No thyromegaly.  Cardiovascular:     Rate and Rhythm: Normal rate and regular rhythm.     Heart sounds: No murmur.  Pulmonary:     Breath sounds: Normal breath sounds.  Abdominal:     Palpations: Abdomen is soft. There is no mass.     Tenderness: There is no abdominal tenderness. There is no guarding.   Musculoskeletal:     Right lower leg: No edema.     Left lower leg: No edema.  Lymphadenopathy:     Cervical: No cervical adenopathy.  Skin:    General: Skin is warm.     Capillary Refill: Capillary refill takes 2 to 3 seconds.     Findings: No rash.  Neurological:     Mental Status: She is alert.     Comments: No tremor     Assessment/Plan: 1. Severe malnutrition ([place]) Weight essentially stable today. We will get labs and an EKG for continued monitoring.  - EKG 12-XNTZ - Basic Metabolic Panel (BMET) - Magnesium - Phosphorus  2. GAD (generalized anxiety disorder) Stable.   3. Anorexia nervosa with dangerously low body weight Needs to step up to residential treatment asap. We discussed this again today and she is agreeable to continuing process with programs and working to tell her parents.   4. Current severe episode of major depressive disorder without psychotic features without prior episode ([place]) Stable. Still no therapist due to need for higher level of care.   5. Hypotension, unspecified hypotension type Improved today.   6. Screening for genitourinary condition wnl . - POCT urinalysis dipstick  Gail Resides, FNP

## 2019-10-20 NOTE — Patient Instructions (Signed)
EKG [phone] 

## 2019-10-21 ENCOUNTER — Other Ambulatory Visit: Payer: Self-pay | Admitting: Pediatrics

## 2019-10-21 DIAGNOSIS — E559 Vitamin D deficiency, unspecified: Secondary | ICD-10-CM

## 2019-10-21 LAB — BASIC METABOLIC PANEL
BUN: 11 mg/dL (ref 7–25)
CO2: 28 mmol/L (ref 20–32)
Calcium: 9.7 mg/dL (ref 8.6–10.2)
Chloride: 103 mmol/L (ref 98–110)
Creat: 0.64 mg/dL (ref 0.50–1.10)
Glucose, Bld: 80 mg/dL (ref 65–99)
Potassium: 4 mmol/L (ref 3.5–5.3)
Sodium: 141 mmol/L (ref 135–146)

## 2019-10-21 LAB — MAGNESIUM: Magnesium: 2.1 mg/dL (ref 1.5–2.5)

## 2019-10-21 LAB — PHOSPHORUS: Phosphorus: 4 mg/dL (ref 2.5–4.5)

## 2019-10-26 ENCOUNTER — Ambulatory Visit (HOSPITAL_COMMUNITY)
Admission: RE | Admit: 2019-10-26 | Discharge: 2019-10-26 | Disposition: A | Discharge status: Home / Self Care | Payer: BC Managed Care – PPO | Source: Ambulatory Visit | Attending: Pediatrics | Admitting: Pediatrics

## 2019-10-26 ENCOUNTER — Other Ambulatory Visit: Payer: Self-pay

## 2019-10-26 DIAGNOSIS — E43 Unspecified severe protein-calorie malnutrition: Secondary | ICD-10-CM | POA: Insufficient documentation

## 2019-10-27 ENCOUNTER — Ambulatory Visit (INDEPENDENT_AMBULATORY_CARE_PROVIDER_SITE_OTHER): Payer: BC Managed Care – PPO | Admitting: Pediatrics

## 2019-10-27 ENCOUNTER — Encounter: Payer: Self-pay | Admitting: Pediatrics

## 2019-10-27 VITALS — BP 91/64 | HR 75 | Ht 64.76 in | Wt 84.4 lb

## 2019-10-27 DIAGNOSIS — F5000 Anorexia nervosa, unspecified: Secondary | ICD-10-CM | POA: Diagnosis not present

## 2019-10-27 DIAGNOSIS — E43 Unspecified severe protein-calorie malnutrition: Secondary | ICD-10-CM

## 2019-10-27 DIAGNOSIS — Z1389 Encounter for screening for other disorder: Secondary | ICD-10-CM | POA: Diagnosis not present

## 2019-10-27 DIAGNOSIS — F322 Major depressive disorder, single episode, severe without psychotic features: Secondary | ICD-10-CM

## 2019-10-27 DIAGNOSIS — L859 Epidermal thickening, unspecified: Secondary | ICD-10-CM

## 2019-10-27 DIAGNOSIS — F411 Generalized anxiety disorder: Secondary | ICD-10-CM

## 2019-10-27 LAB — POCT URINALYSIS DIPSTICK
Bilirubin, UA: NEGATIVE
Blood, UA: NEGATIVE
Glucose, UA: NEGATIVE
Ketones, UA: NEGATIVE
Leukocytes, UA: NEGATIVE
Nitrite, UA: NEGATIVE
Protein, UA: NEGATIVE
Spec Grav, UA: 1.02 (ref 1.010–1.025)
Urobilinogen, UA: NEGATIVE E.U./dL — AB
pH, UA: 8 (ref 5.0–8.0)

## 2019-10-27 NOTE — Patient Instructions (Signed)
Risks of continued malnutrition and lack of treatment:  1. Sudden cardiac death  2. Hypoglycemic seizure or death from hypoglycemia  3. Irreversible loss of brain matter resulting in decreased intelligence, confusion, seizures 4. Irreversible kidney damage  5. Damage to the liver  6. Continued bone loss and fractures  7. Loss of fertility permanently

## 2019-10-28 ENCOUNTER — Other Ambulatory Visit: Payer: Self-pay | Admitting: Family

## 2019-10-28 ENCOUNTER — Other Ambulatory Visit: Payer: Self-pay | Admitting: Pediatrics

## 2019-10-28 DIAGNOSIS — M818 Other osteoporosis without current pathological fracture: Secondary | ICD-10-CM

## 2019-10-28 MED ORDER — TRIAMCINOLONE ACETONIDE 0.5 % EX OINT
1.0000 | TOPICAL_OINTMENT | Freq: Two times a day (BID) | CUTANEOUS | 0 refills | Status: AC
Start: 2019-10-28 — End: ?

## 2019-10-28 MED ORDER — HYDROXYZINE HCL 10 MG PO TABS: 10.0000 mg | ORAL_TABLET | Freq: Three times a day (TID) | ORAL | 0 refills | Status: AC | PRN

## 2019-11-01 ENCOUNTER — Other Ambulatory Visit: Payer: Self-pay

## 2019-11-01 ENCOUNTER — Ambulatory Visit (INDEPENDENT_AMBULATORY_CARE_PROVIDER_SITE_OTHER): Payer: BC Managed Care – PPO | Admitting: Pediatrics

## 2019-11-01 ENCOUNTER — Other Ambulatory Visit (HOSPITAL_COMMUNITY)
Admission: RE | Admit: 2019-11-01 | Discharge: 2019-11-01 | Disposition: A | Discharge status: Home / Self Care | Payer: BC Managed Care – PPO | Source: Ambulatory Visit | Attending: Pediatrics | Admitting: Pediatrics

## 2019-11-01 ENCOUNTER — Encounter (HOSPITAL_COMMUNITY): Payer: Self-pay

## 2019-11-01 ENCOUNTER — Inpatient Hospital Stay (HOSPITAL_COMMUNITY)
Admission: EM | Admit: 2019-11-01 | Discharge: 2019-11-04 | DRG: 883 | Payer: BC Managed Care – PPO | Attending: Family Medicine | Admitting: Family Medicine

## 2019-11-01 VITALS — BP 90/63 | HR 65 | Temp 97.7°F | Ht 64.57 in | Wt 86.0 lb

## 2019-11-01 DIAGNOSIS — R21 Rash and other nonspecific skin eruption: Secondary | ICD-10-CM | POA: Diagnosis present

## 2019-11-01 DIAGNOSIS — E43 Unspecified severe protein-calorie malnutrition: Secondary | ICD-10-CM | POA: Insufficient documentation

## 2019-11-01 DIAGNOSIS — R630 Anorexia: Secondary | ICD-10-CM | POA: Diagnosis not present

## 2019-11-01 DIAGNOSIS — Z8659 Personal history of other mental and behavioral disorders: Secondary | ICD-10-CM | POA: Diagnosis not present

## 2019-11-01 DIAGNOSIS — M810 Age-related osteoporosis without current pathological fracture: Secondary | ICD-10-CM | POA: Diagnosis present

## 2019-11-01 DIAGNOSIS — F5000 Anorexia nervosa, unspecified: Secondary | ICD-10-CM | POA: Diagnosis present

## 2019-11-01 DIAGNOSIS — F322 Major depressive disorder, single episode, severe without psychotic features: Secondary | ICD-10-CM | POA: Diagnosis present

## 2019-11-01 DIAGNOSIS — Z681 Body mass index (BMI) 19 or less, adult: Secondary | ICD-10-CM

## 2019-11-01 DIAGNOSIS — Z113 Encounter for screening for infections with a predominantly sexual mode of transmission: Secondary | ICD-10-CM | POA: Diagnosis present

## 2019-11-01 DIAGNOSIS — F411 Generalized anxiety disorder: Secondary | ICD-10-CM | POA: Diagnosis present

## 2019-11-01 DIAGNOSIS — F5001 Anorexia nervosa, restricting type: Secondary | ICD-10-CM | POA: Diagnosis present

## 2019-11-01 DIAGNOSIS — Z20822 Contact with and (suspected) exposure to covid-19: Secondary | ICD-10-CM | POA: Diagnosis present

## 2019-11-01 DIAGNOSIS — L859 Epidermal thickening, unspecified: Secondary | ICD-10-CM | POA: Insufficient documentation

## 2019-11-01 LAB — CBC
HCT: 44.7 % (ref 36.0–46.0)
Hemoglobin: 13.6 g/dL (ref 12.0–15.0)
MCH: 30.5 pg (ref 26.0–34.0)
MCHC: 30.4 g/dL (ref 30.0–36.0)
MCV: 100.2 fL — ABNORMAL HIGH (ref 80.0–100.0)
Platelets: 251 10*3/uL (ref 150–400)
RBC: 4.46 MIL/uL (ref 3.87–5.11)
RDW: 13.5 % (ref 11.5–15.5)
WBC: 6.1 10*3/uL (ref 4.0–10.5)
nRBC: 0 % (ref 0.0–0.2)

## 2019-11-01 LAB — AMMONIA

## 2019-11-01 LAB — RAPID URINE DRUG SCREEN, HOSP PERFORMED
Amphetamines: NOT DETECTED
Barbiturates: NOT DETECTED
Benzodiazepines: NOT DETECTED
Cocaine: NOT DETECTED
Opiates: NOT DETECTED
Tetrahydrocannabinol: NOT DETECTED

## 2019-11-01 LAB — COMPREHENSIVE METABOLIC PANEL
AG Ratio: 2.2 (calc) (ref 1.0–2.5)
ALT: 17 U/L (ref 6–29)
ALT: 21 U/L (ref 0–44)
AST: 24 U/L (ref 10–30)
AST: 28 U/L (ref 15–41)
Albumin: 5.1 g/dL (ref 3.6–5.1)
Albumin: 5 g/dL (ref 3.5–5.0)
Alkaline Phosphatase: 56 U/L (ref 38–126)
Alkaline phosphatase (APISO): 61 U/L (ref 31–125)
Anion gap: 12 (ref 5–15)
BUN: 10 mg/dL (ref 7–25)
BUN: 9 mg/dL (ref 6–20)
CO2: 30 mmol/L (ref 20–32)
CO2: 26 mmol/L (ref 22–32)
Calcium: 9.7 mg/dL (ref 8.6–10.2)
Calcium: 9.6 mg/dL (ref 8.9–10.3)
Chloride: 102 mmol/L (ref 98–110)
Chloride: 102 mmol/L (ref 98–111)
Creat: 0.67 mg/dL (ref 0.50–1.10)
Creatinine, Ser: 0.65 mg/dL (ref 0.44–1.00)
GFR calc Af Amer: >60 mL/min (ref >60)
GFR calc non Af Amer: >60 mL/min (ref >60)
Globulin: 2.3 g/dL (calc) (ref 1.9–3.7)
Glucose, Bld: 87 mg/dL (ref 65–99)
Glucose, Bld: 79 mg/dL (ref 70–99)
Potassium: 4.6 mmol/L (ref 3.5–5.3)
Potassium: 4.1 mmol/L (ref 3.5–5.1)
Sodium: 141 mmol/L (ref 135–146)
Sodium: 140 mmol/L (ref 135–145)
Total Bilirubin: 0.6 mg/dL (ref 0.2–1.2)
Total Bilirubin: 0.7 mg/dL (ref 0.3–1.2)
Total Protein: 7.4 g/dL (ref 6.1–8.1)
Total Protein: 7.3 g/dL (ref 6.5–8.1)

## 2019-11-01 LAB — PHOSPHORUS
Phosphorus: 3.9 mg/dL (ref 2.5–4.5)
Phosphorus: 4 mg/dL (ref 2.5–4.6)

## 2019-11-01 LAB — B12 AND FOLATE PANEL
Folate: 12.2 ng/mL
Vitamin B-12: 610 pg/mL (ref 200–1100)

## 2019-11-01 LAB — CBC WITH DIFFERENTIAL/PLATELET
Absolute Monocytes: 327 cells/uL (ref 200–950)
Basophils Absolute: 69 cells/uL (ref 0–200)
Basophils Relative: 1.5 %
Eosinophils Absolute: 488 cells/uL (ref 15–500)
Eosinophils Relative: 10.6 %
HCT: 43 % (ref 35.0–45.0)
Hemoglobin: 14 g/dL (ref 11.7–15.5)
Lymphs Abs: 1697 cells/uL (ref 850–3900)
MCH: 31 pg (ref 27.0–33.0)
MCHC: 32.6 g/dL (ref 32.0–36.0)
MCV: 95.1 fL (ref 80.0–100.0)
MPV: 10.4 fL (ref 7.5–12.5)
Monocytes Relative: 7.1 %
Neutro Abs: 2019 cells/uL (ref 1500–7800)
Neutrophils Relative %: 43.9 %
Platelets: 258 10*3/uL (ref 140–400)
RBC: 4.52 10*6/uL (ref 3.80–5.10)
RDW: 12.8 % (ref 11.0–15.0)
Total Lymphocyte: 36.9 %
WBC: 4.6 10*3/uL (ref 3.8–10.8)

## 2019-11-01 LAB — ETHANOL: Alcohol, Ethyl (B): <10 mg/dL (ref <10)

## 2019-11-01 LAB — SARS CORONAVIRUS 2 BY RT PCR (HOSPITAL ORDER, PERFORMED IN ~~LOC~~ HOSPITAL LAB): SARS Coronavirus 2: NEGATIVE

## 2019-11-01 LAB — ACETAMINOPHEN LEVEL: Acetaminophen (Tylenol), Serum: <10 ug/mL — ABNORMAL LOW (ref 10–30)

## 2019-11-01 LAB — I-STAT BETA HCG BLOOD, ED (NOT ORDERABLE): I-stat hCG, quantitative: <5 m[IU]/mL (ref <5)

## 2019-11-01 LAB — MAGNESIUM: Magnesium: 2.2 mg/dL (ref 1.5–2.5)

## 2019-11-01 LAB — SALICYLATE LEVEL: Salicylate Lvl: <7 mg/dL — ABNORMAL LOW (ref 7.0–30.0)

## 2019-11-01 LAB — LIPASE: Lipase: 26 U/L (ref 7–60)

## 2019-11-01 LAB — AMYLASE: Amylase: 81 U/L (ref 21–101)

## 2019-11-01 MED ORDER — OLANZAPINE 5 MG PO TABS
5.0000 mg | ORAL_TABLET | Freq: Every day | ORAL | Status: DC
  Administered 2019-11-04: 5 mg via ORAL
  Filled 2019-11-01 (×3): qty 1

## 2019-11-01 MED ORDER — POLYETHYLENE GLYCOL 3350 17 G PO PACK: 17.0000 g | PACK | Freq: Every day | ORAL | Status: DC | PRN

## 2019-11-01 MED ORDER — ADULT MULTIVITAMIN W/MINERALS CH
1.0000 | ORAL_TABLET | Freq: Every day | ORAL | Status: DC
  Administered 2019-11-02 – 2019-11-04 (×3): 1 via ORAL
  Filled 2019-11-01 (×3): qty 1

## 2019-11-01 MED ORDER — ENOXAPARIN SODIUM 300 MG/3ML IJ SOLN
20.0000 mg | INTRAMUSCULAR | Status: DC
  Administered 2019-11-02 – 2019-11-03 (×2): 20 mg via SUBCUTANEOUS
  Filled 2019-11-01 (×3): qty 0.2

## 2019-11-01 MED ORDER — ZINC OXIDE 40 % EX OINT
TOPICAL_OINTMENT | Freq: Every day | CUTANEOUS | Status: DC
  Filled 2019-11-01: qty 57

## 2019-11-01 MED ORDER — TRIAMCINOLONE ACETONIDE 0.5 % EX OINT
1.0000 "application " | TOPICAL_OINTMENT | Freq: Two times a day (BID) | CUTANEOUS | Status: DC
  Administered 2019-11-02 – 2019-11-04 (×5): 1 via TOPICAL
  Filled 2019-11-01: qty 15

## 2019-11-01 MED ORDER — HYDROXYZINE HCL 10 MG PO TABS
10.0000 mg | ORAL_TABLET | Freq: Three times a day (TID) | ORAL | Status: DC | PRN
  Filled 2019-11-01: qty 1

## 2019-11-01 MED ORDER — MELATONIN 5 MG PO TABS
5.0000 mg | ORAL_TABLET | Freq: Every evening | ORAL | Status: DC | PRN
  Administered 2019-11-02 – 2019-11-03 (×2): 5 mg via ORAL
  Filled 2019-11-01 (×2): qty 1

## 2019-11-01 MED ORDER — SERTRALINE HCL 50 MG PO TABS
150.0000 mg | ORAL_TABLET | Freq: Every day | ORAL | Status: DC
  Administered 2019-11-02 – 2019-11-04 (×3): 150 mg via ORAL
  Filled 2019-11-01 (×3): qty 3

## 2019-11-01 MED ORDER — LORATADINE 10 MG PO TABS
10.0000 mg | ORAL_TABLET | Freq: Every day | ORAL | Status: DC
  Administered 2019-11-02 – 2019-11-04 (×3): 10 mg via ORAL
  Filled 2019-11-01 (×3): qty 1

## 2019-11-01 NOTE — Progress Notes (Signed)
History was provided by the patient.  Gail Anderson is a 24 year old AFAB who identifies as female who is here for follow-up of her eating disorder.    HPI:  Pt reports that her parents came and visited over the weekend to help move her into a new apartment. She met with her dietician yesterday, continues to meet twice weekly. Still hasn't been able to find a therapist. Overall feels that things are going well, doesn't think that she would benefit from residential treatment at this point given that she has tried it in the past and hasn't been successful.  No binging or purging behaviors over the last two weeks. Denies palpitations, syncope, lightheadedness, chest pain, SOB, lower extremity swelling. Her rash is minimally improved with hydrocortisone, although it has helped the itching.  LMP: 06/2018  ROS: Negative in 10 systems as above Binges: none Purging: none Body check but no weighing   24 food recall:  B: oatmeal with flaxseed, berries, peanut butter, soymilk latte S: luna bar L: english muffin with cream cheese, egg, carrots with hummus S: kind bar D: salad with sweet potato, hardboiled egg, roasted veggies, feta, and dressing Bedtime: yogurt with half a banana, almonds, ensure  Social History: Confidentiality was discussed with the patient and if applicable, with caregiver as well. Tobacco: no Secondhand smoke exposure? no Drugs/EtOH: no Sexually active? Yes in last year - 5 partners, men, uses condoms - majority of the time, stopped OCPs one year ago,  Last STI Screening:2019, will repeat today Pregnancy Prevention: condoms     Physical Exam:  Gen: Very thin young woman sitting on exam room table, tearful HEENT: posterior oropharynx clear, no oral lesions, no icterus, PERRL CV: no cervical LN CV: RRR no MRG Pulm: Clear to auscultation throughout Abd: Thin, nontender Extremities: acrocyanosis Skin: Raised erythematous rash over forearms, in axilla, over  chest  Assessment/Plan: Anorexia nervosa, restrictive type: Severe anorexia, with BMP of 14, amenorrhea, significantly affected bone mineral density. She has been struggling with this for many years and has not ever been fully weight restored. Patient is at significant risk of sudden cardiac death or death from electrolyte disturbance due to refeeding syndrome. We have recommended inpatient treatment at multiple visits. Patient will not allow [place] to discuss treatment with her family, given her severe anorexia and risk to patient's life, we gave her the option of calling parents vs. IVC. Patient chose to go ahead with IVC. Paperwork completed by Dr Henrene Pastor in clinic and patient escorted to the ED.   Hyperkeratinzation: rash on arms likely secondary to malnutrition. Will r/o zinc deficiency.  - f/u zinc level

## 2019-11-01 NOTE — ED Notes (Addendum)
Beta HCG <5, ISTAT not crossing over.

## 2019-11-01 NOTE — Progress Notes (Signed)
Recommendations for inpatient treatment:  1. Start at 1200 kcal daily, advance by 200 kcal daily to goal per dietitian (high refeeding risk)  2. Electrolyte monitoring BID x 72 hours, then daily x 3 days, then QOD. 3. Goal for discharge is 7 days of refeeding, including 5 FULL days of nutrition (not missing meals or supplements)  4. Avoid IV fluids. Use NGT when necessary  5. Continue outpatient psychiatric medications  6. Continue discussions with residential treatment centers   Please refer to Pediatric Medical Refeeding Guidelines for further details and guidelines.   Will ask Dr. Colvin Caroli for assistance as well.   I will round on her daily Monday-Friday. Please text or call [phone] (cell) with questions.   Appreciate family medicine team's assistance.   Alfonso Ramus, FNP

## 2019-11-01 NOTE — ED Notes (Signed)
Report given to 6 N RN. All questions answered 

## 2019-11-01 NOTE — ED Triage Notes (Signed)
Patient arrived by [facility] with IVC paperwork. Patient was seen by her MD today and due to ongoing issues with anorexia patient placed under IVC. Patient alert and oriented, denies SI and HI

## 2019-11-01 NOTE — ED Notes (Signed)
UPREG NEGATIVE . MRN not connected to pt's chart .

## 2019-11-01 NOTE — H&P (Addendum)
[facility] Admission History and Physical Service Pager: [phone]  Patient name: Gail Anderson Medical record number: [id number] Date of birth: 11/24/1995 Age: 24 y.o. Gender: female  Primary Care Provider: Patient, No Pcp Per Consultants: Adolescent medicine  Code Status: Full Preferred Emergency Contact: Celsey Asselin and Catelin Manthe (mom cell [phone])  Chief Complaint: Eating Disorder  Assessment and Plan: Gail Anderson is a 24 y.o. female presented via [employer] with IVC paperwork for dangerously low body weight. PMH is significant for anorexia nervosa with severe malnutrition,  GAD, MDD  Anorexia Nervosa restricting type  Severe Malnutrition  Low BMI Patient diagnosed with disordered eating in 2015.  She follows with a dietician and follows restrictive vegan diet. Patient was IVC'd by adolescent medicine (Dr Lenore Cordia) today and escorted to the ED by [facility]. Denies previous hospitalizations relating to eating. Denies recent binging or purging. No hx of self-harm or suicide attempts. Patient reports last period was Feb 2020. Patient BMI below 75%lie for her age group, ~64%lie. Current BMI 14.3. BMI since Jan 2019 14.15-16.22.  Reports she has been too busy or forgets to eat recently. Patient has failed outpatient treatment and is at significant risk of sudden cardiac death or death from electrolyte disturbance due to refeeding syndrome..  A multidisciplinary approach is warranted with psychology, registered dietician and physicians. She will need to be monitored closely for refeeding syndrome. At time of admission, patient opted for voluntary admission. IVC paperwork was rescinded in the ED. Patient vital signs are stable and her lab work in the ED was grossly unremarkable (CBC, CMP, phosphorus,  UDS, pregnancy test, EtOH level, ASA level).  - Admit to med-surg, attending Dr. McDiarmid  - Aldolescent medicine Jonathon Resides, NP) following, appreciate  recommendations  - Consult registered dietician  - Consult with Dr. Hulen Skains, child psychologist  - Consult social work - Obtain Admission labs: CBC, ESR, CMP, Phos, Mg, Amylase, Lipase, Free T4, Free T3, GTT, B12,  LH, FSH, Prolactin, Estradiol, , UA - Daily labs: UA, Mg, Phos, BMP             Electrolyte monitoring BID x 72 hours, then daily x 3 d, then QOD             UA QD - AM EKG  x3 days then as needed  - Weigh patient, weekly  -Multivitamin with minerals daily  -Melatonin '5mg'$  qhs prn  - Home Olanzapine 5 mg daily  -Hydroxyzine '10mg'$  TID for meal anxiety  - 1:1 sitter  - vitals per unit routine  - obtain AM orthostatics  - Strict I's and Os  - Per Zadie Cleverly, FNP: Start at 1200kcal daily, advance by 200 kcal daily to goal per dietitian - avoid IV fluids - NGT if necessary - May refer to Eating Disorder protocol if needed   Rash  Fine pruritic papular rash on medial anterior arms bilaterally, axillae bilaterally, lateral hips b/l, and anterior chest and neck. Rash has not worsened or improved with OTC steroid treatment. See pictures below. Recently prescribed Triamcinolone but this has not been filled. With patient's hx of starvation follicular hyperkeratosis and asteatotic "dry eczema" must be included in the differential.  High concern for vitamin (A, B6, ect.) or mineral deficiency (Zn) related rashes.  Through can not rule out atopic vs contact vs irrigant dermatitis.  Less likely fungal given distribution and presentation, however may consider obtaining KOH if no improvement with above treatment. - Kenalog 0.5% BID  - Atarax prn  for itching - Desitin cream daily   - Consider Vit A, B6 testing  - Follow up on Zn level ordered outpatient  - Daily multivitamin w/ mineral  - consider KOH +/- biopsy if no improvement with above treatement  GAD  MDD  Home medications include Sertraline 150 mg daily, Olanzapine 5 mg daily, Atarax 10 mg TID.  - Continue home medications    FEN/GI: regular diet, replete electrolytes as needed  Prophylaxis: Lovenox   Disposition: Admit to med-surg, Goal for discharge 7 days of refeeding, including 5 full days of nutrition (not missing meals or supplements)    History of Present Illness:  Gail Anderson is a 24 y.o. female presented via GDP with IVC paperwork for admission after failing outpatient treatment for her anorexia nervosa restrictive type.   Patient presented to clinic today and was found to have a BMI of 14.3. Per adolescent medicine notes, "has been struggling with this for many years and has not ever been fully weight restored." Patient has been resistant to inpatient treatment in the past. IVC paperwork was completed by Dr. Henrene Pastor for inpatient treatment.   Patient reports understanding of by she was brought to the ED and chooses to be admitted voluntarily. Denies laxative use, purging, binging.  Has not yet found a therapist. Denies hx of prior hospitalizations relating to her disordered eating.  She is followed outpatient by dietician and adolescent medicine, Laveda Abbe, NP.  She attends college at [employer].  She takes a multivitamin, Vit D and Ca supplements. She takes medication for anxiety and depression.  She recently started Zyprexa. She has not had a period since Feb 2020. Denies SI, HI, prior SA and hx of self harm.  She recently moved to her own apartment.   She denies palpitations, dizziness, chest discomfort, shortness of breath or syncope. She has rash that appeared about 2 weeks ago.    Of note, patient had not yet discussed the severity of her disordered eating with her parents. She has agreed to allow updates to her parents Broadus John and Lashane Whelpley.    Review Of Systems: Per HPI with the following additions:   Review of Systems  Constitutional: Negative for fever.  HENT: Negative for congestion and sore throat.   Respiratory: Negative for shortness of breath.   Cardiovascular: Negative for  chest pain and palpitations.  Gastrointestinal: Negative for abdominal pain, nausea and vomiting.  Genitourinary: Negative for dysuria.  Skin: Positive for itching and rash.  Neurological: Negative for headaches.  Psychiatric/Behavioral: Negative for suicidal ideas.  All other systems reviewed and are negative.    Patient Active Problem List   Diagnosis Date Noted  . Hyperkeratosis 11/01/2019  . Hypotension 10/13/2019  . Current severe episode of major depressive disorder without psychotic features without prior episode ([place]) 10/10/2019  . Severe malnutrition ([place]) 10/10/2019  . Anorexia nervosa with dangerously low body weight 09/29/2019  . Constipation 08/19/2017  . GAD (generalized anxiety disorder) 08/01/2017  . Osteoporosis 06/17/2017    Past Medical History: Past Medical History:  Diagnosis Date  . Eating disorder   . Osteoporosis 05/2017    Past Surgical History: Past Surgical History:  Procedure Laterality Date  . NASAL SINUS SURGERY      Social History: Social History   Tobacco Use  . Smoking status: Never Smoker  . Smokeless tobacco: Never Used  Substance Use Topics  . Alcohol use: No  . Drug use: No   Additional social history:  Please also refer to  relevant sections of EMR.  Family History: Family History  Problem Relation Age of Onset  . Hyperlipidemia Mother   . Heart disease Paternal Grandfather      Allergies and Medications: Allergies  Allergen Reactions  . Avocado Nausea And Vomiting  . Banana Nausea And Vomiting  . Latex   . Pineapple Itching  . Penicillins Rash   No current facility-administered medications on file prior to encounter.   Current Outpatient Medications on File Prior to Encounter  Medication Sig Dispense Refill  . Cholecalciferol (VITAMIN D) 50 MCG (2000 UT) CAPS Take 1 capsule (2,000 Units total) by mouth daily. (Patient not taking: Reported on 10/13/2019) 30 capsule 2  . hydrOXYzine (ATARAX/VISTARIL) 10 MG tablet  Take 1 tablet (10 mg total) by mouth 3 (three) times daily as needed. 30 tablet 0  . lactobacillus acidophilus (BACID) TABS tablet Take 2 tablets by mouth 3 (three) times daily.    . Multiple Vitamin (MULTIVITAMIN) tablet Take 1 tablet by mouth daily.    Marland Kitchen OLANZapine (ZYPREXA) 5 MG tablet Take 1/2 tablet for 3 days. After 3 days, increase to 1 whole tablet 30 tablet 3  . Oyster Shell Calcium 500 MG TABS TAKE 2 TABLETS (1,000 MG TOTAL) BY MOUTH DAILY. 180 tablet 1  . sertraline (ZOLOFT) 100 MG tablet TAKE 1 TABLET BY MOUTH EVERY DAY 90 tablet 0  . triamcinolone ointment (KENALOG) 0.5 % Apply 1 application topically 2 (two) times daily. 30 g 0  . [DISCONTINUED] sertraline (ZOLOFT) 100 MG tablet Take 1 tablet (100 mg total) by mouth daily. 90 tablet 0  . [DISCONTINUED] sertraline (ZOLOFT) 100 MG tablet Take 1.5 tablets (150 mg total) by mouth daily. 45 tablet 3  . [DISCONTINUED] Vitamin D, Ergocalciferol, (DRISDOL) 1.25 MG (50000 UNIT) CAPS capsule Take 1 capsule (50,000 Units total) by mouth every 7 (seven) days. (Patient not taking: Reported on 10/10/2019) 12 capsule 0    Objective: BP 113/84 (BP Location: Right Arm)   Pulse 64   Temp 98.7 F (37.1 C) (Oral)   Resp 12   Ht _0  (1.651 m)   SpO2 100%   BMI 14.31 kg/m   Exam: GEN:     alert, cooperative and in no acute distress    HENT:  mucus membranes moist, oropharyngeal without lesions or erythema,  nares patent, no nasal discharge  EYES:   pupils equal and reactive, EOM intact NECK:  supple, normal ROM RESP:  clear to auscultation bilaterally, no increased work of breathing  CVS:   regular rate and rhythm, no murmur, distal pulses intact  ABD:  soft, scaphoid abdomen,  non-tender; bowel sounds present; no palpable masses EXT:   normal ROM, thin extremities, spinous process evident,  NEURO:  normal without focal findings,  speech normal, alert and oriented  Skin:   warm and dry, normal skin turgor, papular rash with scaling to b/l  upper extremities, anterior chest and neck with excoriations, extremity bruising present, no knuckle abrasions, no lingula  Psych: no SI, no HI, normal affect             Labs and Imaging: CBC BMET  Recent Labs  Lab 11/01/19 1536  WBC 6.1  HGB 13.6  HCT 44.7  PLT 251   Recent Labs  Lab 11/01/19 1536  NA 140  K 4.1  CL 102  CO2 26  BUN 9  CREATININE 0.65  GLUCOSE 79  CALCIUM 9.6      No results found.   Lyndee Hensen, DO  11/01/2019, 9:05 PM PGY-1, [place] [facility] pager: [phone], text pages welcome  Upper Level Addendum:  I have seen and evaluated this patient along with Dr. Susa Simmonds and reviewed the above note.  Mina Marble, D.O. 11/02/2019, 7:00 AM PGY-2, [place]

## 2019-11-01 NOTE — BH Assessment (Signed)
Tele Assessment Note   Patient Name: Gail Anderson MRN: [id number] Referring Physician: Melene Plan, DO Location of Patient: [facility] Location of Provider: Behavioral Health TTS Department  Gail Anderson is an 24 y.o. female. Pt presents to [facility] under IVC by her Dietician. Per EDP report on 11/01/19, "Dr. Carver Fila feels as though she is failing outpatient management and will require inpatient stabilization for her eating disorder and hyperkeratinization secondary to malnutrition.  On my examination, patient is understanding that she needs to be admitted to the hospital for inpatient management in context of her anorexia.  She states that she had a busy weekend and simply did not eat a lot.  Her pediatrician and registered dietitian are both very concerned that she is going to continue to deteriorate if not admitted.  On my examination, she does appear to be malnourishedShe is taking multivitamins as well as an SSRI for her anxiety.  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 Pt states her doctor (peditrician ) IVC her due to ongoing anorexia and weight issues. Pt states she was diagnosed with disorder in 2015. She states her weight has been up and down over the years but stable last few weeks. Pt states she is currently on restrictive diet from dietician and has been for several months, she is mostly vegan and cut out meat, dairy products and junk food she states. Pt unsure of her last weight amount since last visit to doctor.Pt currently denies SI, HI, AVH and any self harming behaviors. Pt reports no past SI attempts, psych history or psychiatric inpatient treatment history. Per pt chart she was previously diagnosed with depression and anxiety. Pt states she did take Abilify last year but denies taking any current psychatric medications. Pt reports no current provider, last provider was a year ago when she attended residential  treatment for anorexia. Pt denies most symptoms of depression currently except for irritability. Pt reports she did have a depressive episode in March 2021 such as decreased vegetative symptoms but states she has not experienced this since then. Pt also reports no previous panic attacks. Pt reports getting 6 to 8 hours of sleep daily and a fair appetite, ( 6 small meals a day). Pt reports no trauma/abuse as an adult or child. Pt denies any family history of trauma, abuse, substance use or SI. Pt denies any current or past drug use.  Pt states she struggles with anorexia but is very self aware and controls her diet and food intake. Pt cooperative throughout assessment, pleasant. Pt did not present to be responding to internal stimuli or delusional content and can contract for safety at this time. Pt also declined to provide TTS with collateral information at this time.    Per nurse,  Langston Reusing at [facility] she states that pt is being medically admitted under Dr. Rachael Darby  and her IVC will be rescinded by Jacklynn Lewis, PA. Also per Army Melia, DO at [facility] who is attending provider, she states that pt will be admitted to medical floor and a disposition for patient is no longer needed, however TTS consult already complete will be used for further insight and details regarding pt.        Diagnosis: Per chart history : F50.01 Anorexia nervosa, Restricting type  Past Medical History:  Past Medical History:  Diagnosis Date  . Eating disorder   . Osteoporosis 05/2017    Past Surgical History:  Procedure  Laterality Date  . NASAL SINUS SURGERY      Family History:  Family History  Problem Relation Age of Onset  . Hyperlipidemia Mother   . Heart disease Paternal Grandfather     Social History:  reports that she has never smoked. She has never used smokeless tobacco. She reports that she does not drink alcohol or use drugs.  Additional Social History:  Alcohol / Drug Use Pain Medications:  see MAR Prescriptions: see MAR Over the Counter: see MAR  CIWA: CIWA-Ar BP: 113/84 Pulse Rate: 64 COWS:    Allergies:  Allergies  Allergen Reactions  . Avocado Nausea And Vomiting  . Banana Nausea And Vomiting  . Latex   . Pineapple Itching  . Penicillins Rash    Home Medications: (Not in a hospital admission)   OB/GYN Status:  No LMP recorded.  General Assessment Data Location of Assessment: [facility] ED TTS Assessment: In system Is this a Tele or Face-to-Face Assessment?: Tele Assessment Is this an Initial Assessment or a Re-assessment for this encounter?: Initial Assessment Patient Accompanied by:: N/A Language Other than English: No Living Arrangements: Other (Comment) What gender do you identify as?: Female Date Telepsych consult ordered in [facility]: 11/01/19 Time Telepsych consult ordered in [facility]: [place] Marital status: Single Pregnancy Status: No Living Arrangements: Alone Can pt return to current living arrangement?: Yes Admission Status: Involuntary Petitioner: Other Is patient capable of signing voluntary admission?: No Referral Source: Self/Family/Friend Insurance type: [place] Living Arrangements: Alone Legal Guardian: Other:(s) Name of Psychiatrist: none  Education Status Is patient currently in school?: Yes Current Grade: (senior in college) Highest grade of school patient has completed: (college) Name of school: [employer] to self with the past 6 months Suicidal Ideation: No Has patient been a risk to self within the past 6 months prior to admission? : No Suicidal Intent: No Has patient had any suicidal intent within the past 6 months prior to admission? : No Is patient at risk for suicide?: No Suicidal Anderson?: No Has patient had any suicidal Anderson within the past 6 months prior to admission? : No Access to Means: No What has been your use of drugs/alcohol within the last 12 months?: none Previous Attempts/Gestures:  No How many times?: 0 Other Self Harm Risks: none Triggers for Past Attempts: Unknown Intentional Self Injurious Behavior: None Family Suicide History: No Recent stressful life event(s): Other (Comment) Persecutory voices/beliefs?: No Depression: Yes Depression Symptoms: Feeling angry/irritable Substance abuse history and/or treatment for substance abuse?: No Suicide prevention information given to non-admitted patients: Not applicable  Risk to Others within the past 6 months Homicidal Ideation: No Does patient have any lifetime risk of violence toward others beyond the six months prior to admission? : No Thoughts of Harm to Others: No Current Homicidal Intent: No Current Homicidal Anderson: No Access to Homicidal Means: No Identified Victim: none History of harm to others?: No Assessment of Violence: None Noted Violent Behavior Description: none Does patient have access to weapons?: No Criminal Charges Pending?: No Does patient have a court date: No Is patient on probation?: No  Psychosis Hallucinations: None noted Delusions: None noted  Mental Status Report Appearance/Hygiene: In scrubs Eye Contact: Good Motor Activity: Freedom of movement Speech: Logical/coherent Level of Consciousness: Alert Mood: Euthymic Affect: Appropriate to circumstance Anxiety Level: None Thought Processes: Coherent, Relevant Judgement: Unimpaired Orientation: Person, Place, Time, Situation Obsessive Compulsive Thoughts/Behaviors: None  Cognitive Functioning Concentration: Normal Memory: Recent Intact Is patient  IDD: No Insight: Good Impulse Control: Fair Appetite: Fair Have you had any weight changes? : No Change Sleep: No Change Total Hours of Sleep: (6 to 8 hours) Vegetative Symptoms: None  ADLScreening [facility] Assessment Services) Patient's cognitive ability adequate to safely complete daily activities?: Yes Patient able to express need for assistance with ADLs?: Yes Independently  performs ADLs?: Yes (appropriate for developmental age)  Prior Inpatient Therapy Prior Inpatient Therapy: No  Prior Outpatient Therapy Prior Outpatient Therapy: Yes Prior Therapy Dates: (2020) Prior Therapy Facilty/Provider(s): [facility] Reason for Treatment: anorexia Does patient have an ACCT team?: No Does patient have Intensive In-House Services?  : No Does patient have [facility] services? : No Does patient have P4CC services?: No  ADL Screening (condition at time of admission) Patient's cognitive ability adequate to safely complete daily activities?: Yes Patient able to express need for assistance with ADLs?: Yes Independently performs ADLs?: Yes (appropriate for developmental age)             Advance Directives (For Healthcare) Does Patient Have a Medical Advance Directive?: No Would patient like information on creating a medical advance directive?: No - Patient declined          Disposition: Per nurse,  Langston Reusing at [facility] she states that pt is being medically admitted under Dr. Rachael Darby  and her IVC will be rescinded by Jacklynn Lewis, PA. Also per Army Melia, DO at [facility] who is attending provider, she states that pt will be admitted to medical floor and a disposition for patient is no longer needed, however TTS consult already complete will be used for further insight and details regarding pt.  Disposition Initial Assessment Completed for this Encounter: Yes  This service was provided via telemedicine using a 2-way, interactive audio and video technology.  Names of all persons participating in this telemedicine service and their role in this encounter. Name: Gail Anderson Role:Patient  Name: Lacey Jensen Role: TTS  Name:  Role:   Name:  Role:     Natasha Mead 11/01/2019 9:04 PM

## 2019-11-01 NOTE — Progress Notes (Signed)
History was provided by the patient.  Gail Anderson is a 24 y.o. female who is here for severe malnutrition, anorexia, osteoporosis, anxiety, depression.  Patient, No Pcp Per   HPI:  Pt reports that her family is coming this weekend and she plans to talk to them about treatment options.   Has developed a new, diffuse rash across bilateral forearms, behind knees, chest. It is itchy. She has tried some cortisone cream with only minor success.   She continues to feel she doesn't need residential and is starting a new job and moving into a new apartment this weekend. Is determine to finish school.   Denies SI. Continues to have constipation.   No LMP recorded.  Review of Systems  Constitutional: Negative for malaise/fatigue.  Eyes: Negative for double vision.  Respiratory: Negative for shortness of breath.   Cardiovascular: Negative for chest pain and palpitations.  Gastrointestinal: Negative for abdominal pain, constipation, diarrhea, nausea and vomiting.  Genitourinary: Negative for dysuria.  Musculoskeletal: Negative for joint pain and myalgias.  Skin: Positive for itching and rash.  Neurological: Negative for dizziness and headaches.  Endo/Heme/Allergies: Does not bruise/bleed easily.  Psychiatric/Behavioral: Positive for depression. Negative for suicidal ideas. The patient is nervous/anxious and has insomnia.     Patient Active Problem List   Diagnosis Date Noted  . Hypotension 10/13/2019  . Current severe episode of major depressive disorder without psychotic features without prior episode (HCC) 10/10/2019  . Severe malnutrition (HCC) 10/10/2019  . Anorexia nervosa with dangerously low body weight 09/29/2019  . Constipation 08/19/2017  . GAD (generalized anxiety disorder) 08/01/2017  . Osteoporosis 06/17/2017    Current Outpatient Medications on File Prior to Visit  Medication Sig Dispense Refill  . lactobacillus acidophilus (BACID) TABS tablet Take 2 tablets by mouth 3  (three) times daily.    . Multiple Vitamin (MULTIVITAMIN) tablet Take 1 tablet by mouth daily.    Marland Kitchen OLANZapine (ZYPREXA) 5 MG tablet Take 1/2 tablet for 3 days. After 3 days, increase to 1 whole tablet 30 tablet 3  . sertraline (ZOLOFT) 100 MG tablet TAKE 1 TABLET BY MOUTH EVERY DAY 90 tablet 0  . Cholecalciferol (VITAMIN D) 50 MCG (2000 UT) CAPS Take 1 capsule (2,000 Units total) by mouth daily. (Patient not taking: Reported on 10/13/2019) 30 capsule 2  . [DISCONTINUED] sertraline (ZOLOFT) 100 MG tablet Take 1 tablet (100 mg total) by mouth daily. 90 tablet 0  . [DISCONTINUED] sertraline (ZOLOFT) 100 MG tablet Take 1.5 tablets (150 mg total) by mouth daily. 45 tablet 3  . [DISCONTINUED] Vitamin D, Ergocalciferol, (DRISDOL) 1.25 MG (50000 UNIT) CAPS capsule Take 1 capsule (50,000 Units total) by mouth every 7 (seven) days. (Patient not taking: Reported on 10/10/2019) 12 capsule 0   No current facility-administered medications on file prior to visit.    Allergies  Allergen Reactions  . Avocado Nausea And Vomiting  . Banana Nausea And Vomiting  . Latex   . Pineapple Itching  . Penicillins Rash     Physical Exam:    Vitals:   10/27/19 1118 10/27/19 1131  BP: 90/62 91/64  Pulse: (!) 58 75  Weight: 84 lb 6.4 oz (38.3 kg)   Height: 5' 4.76" (1.645 m)     Growth percentile SmartLinks can only be used for patients less than 46 years old.  Physical Exam Vitals and nursing note reviewed.  Constitutional:      General: She is not in acute distress.    Appearance: She is well-developed.  Neck:  Thyroid: No thyromegaly.  Cardiovascular:     Rate and Rhythm: Normal rate and regular rhythm.     Heart sounds: No murmur.  Pulmonary:     Breath sounds: Normal breath sounds.  Abdominal:     Palpations: Abdomen is soft. There is no mass.     Tenderness: There is no abdominal tenderness. There is no guarding.  Musculoskeletal:     Right lower leg: No edema.     Left lower leg: No  edema.  Lymphadenopathy:     Cervical: No cervical adenopathy.  Skin:    General: Skin is warm.     Capillary Refill: Capillary refill takes 2 to 3 seconds.     Findings: Rash present.  Neurological:     Mental Status: She is alert.     Comments: No tremor     Assessment/Plan: 1. Severe malnutrition ([place]) Labs today. We discussed that we can involve her family or proceed toward IVC to get her the help she needs. I asked her to bring her parents on Tuesday morning for appt before they leave.  - Comprehensive metabolic panel - Magnesium - Phosphorus - Amylase - Lipase - CBC with Differential/Platelet - B12 and Folate Panel - Ammonia  2. Current severe episode of major depressive disorder without psychotic features without prior episode ([place]) Continue medications. No current therapist due to degree of illness.   3. GAD (generalized anxiety disorder) As above.   4. Anorexia nervosa with dangerously low body weight 68% of ideal bodyweight. She is at significant risk of sudden death, which she acknowledges again today. If she were to truly be able to refeed, she will be at high risk of refeeding syndrome as well.  5. Rash  Suspect eczema vs. Nutrient deficiency. Will use hydroxyzine 10 mg PRN and triamcinolone.   Jonathon Resides, FNP

## 2019-11-01 NOTE — ED Notes (Signed)
Keys given to brother per ok by pt

## 2019-11-01 NOTE — ED Provider Notes (Signed)
[facility] DEPART[facility] Provider Note   CSN: [id number] Arrival date & time: 11/01/19  1445     History Chief Complaint  Patient presents with  . Anxiety    Gail Anderson is a 24 y.o. female who presents to the ED from her pediatrician via GPD with IVC paperwork for ongoing issues related to her known anorexia.  I personally reviewed patient's medical chart and Dr. Patty Sermons feels as though she is failing outpatient management and will require inpatient stabilization for her eating disorder and hyperkeratinization secondary to malnutrition.  On my examination, patient is understanding that she needs to be admitted to the hospital for inpatient management in context of her anorexia.  She states that she had a busy weekend and simply did not eat a lot.  Her pediatrician and registered dietitian are both very concerned that she is going to continue to deteriorate if not admitted.  On my examination, she does appear to be malnourished.  She is taking multivitamins as well as an SSRI for her anxiety.  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  She was living with a few roommates of campus, but recently moved to an apartment in [place], [place] where she is living alone.  She denies any recent illness, fevers or chills, SOB, lightheadedness, extremity swelling, cough, abdominal pain, nausea vomiting, loose stools, or urinary symptoms.  She has been vaccinated for COVID-19.   HPI     Past Medical History:  Diagnosis Date  . Eating disorder   . Osteoporosis 05/2017    Patient Active Problem List   Diagnosis Date Noted  . Hyperkeratosis 11/01/2019  . Hypotension 10/13/2019  . Current severe episode of major depressive disorder without psychotic features without prior episode ([place]) 10/10/2019  . Severe malnutrition ([place]) 10/10/2019  . Anorexia nervosa with dangerously low body weight 09/29/2019  . Constipation 08/19/2017  . GAD (generalized anxiety  disorder) 08/01/2017  . Osteoporosis 06/17/2017    Past Surgical History:  Procedure Laterality Date  . NASAL SINUS SURGERY       OB History   No obstetric history on file.     Family History  Problem Relation Age of Onset  . Hyperlipidemia Mother   . Heart disease Paternal Grandfather     Social History   Tobacco Use  . Smoking status: Never Smoker  . Smokeless tobacco: Never Used  Substance Use Topics  . Alcohol use: No  . Drug use: No    Home Medications Prior to Admission medications   Medication Sig Start Date End Date Taking? Authorizing Provider  Cholecalciferol (VITAMIN D) 50 MCG (2000 UT) CAPS Take 1 capsule (2,000 Units total) by mouth daily. Patient not taking: Reported on 10/13/2019 08/01/19   Jonathon Resides T, FNP  hydrOXYzine (ATARAX/VISTARIL) 10 MG tablet Take 1 tablet (10 mg total) by mouth 3 (three) times daily as needed. 10/28/19   Parthenia Ames, NP  lactobacillus acidophilus (BACID) TABS tablet Take 2 tablets by mouth 3 (three) times daily.    [provider]  Multiple Vitamin (MULTIVITAMIN) tablet Take 1 tablet by mouth daily.    [provider]  OLANZapine (ZYPREXA) 5 MG tablet Take 1/2 tablet for 3 days. After 3 days, increase to 1 whole tablet 09/12/19   Trude Mcburney, FNP  [place] Calcium 500 MG TABS TAKE 2 TABLETS (1,000 MG TOTAL) BY MOUTH DAILY. 10/28/19   Parthenia Ames, NP  sertraline (ZOLOFT) 100 MG tablet TAKE 1  TABLET BY MOUTH EVERY DAY 10/19/19   Alfonso Ramus T, FNP  triamcinolone ointment (KENALOG) 0.5 % Apply 1 application topically 2 (two) times daily. 10/28/19   Georges Mouse, NP  sertraline (ZOLOFT) 100 MG tablet Take 1 tablet (100 mg total) by mouth daily. 02/10/19   Georges Mouse, NP  sertraline (ZOLOFT) 100 MG tablet Take 1.5 tablets (150 mg total) by mouth daily. 10/08/19   Verneda Skill, FNP  Vitamin D, Ergocalciferol, (DRISDOL) 1.25 MG (50000 UNIT) CAPS capsule Take 1 capsule (50,000 Units  total) by mouth every 7 (seven) days. Patient not taking: Reported on 10/10/2019 08/02/19   Verneda Skill, FNP    Allergies    Avocado, Banana, Latex, Pineapple, and Penicillins  Review of Systems   Review of Systems  All other systems reviewed and are negative.   Physical Exam Updated Vital Signs BP 113/84 (BP Location: Right Arm)   Pulse 64   Temp 98.7 F (37.1 C) (Oral)   Resp 12   Ht 5\' 5"  (1.651 m)   SpO2 100%   BMI 14.31 kg/m   Physical Exam Vitals and nursing note reviewed. Exam conducted with a chaperone present.  Constitutional:      Comments: Thin.  HENT:     Head: Normocephalic and atraumatic.  Eyes:     General: No scleral icterus.    Conjunctiva/sclera: Conjunctivae normal.     Pupils: Pupils are equal, round, and reactive to light.  Cardiovascular:     Rate and Rhythm: Normal rate and regular rhythm.     Pulses: Normal pulses.     Heart sounds: Normal heart sounds.  Pulmonary:     Effort: Pulmonary effort is normal. No respiratory distress.     Breath sounds: Normal breath sounds.  Musculoskeletal:     Cervical back: Normal range of motion and neck supple. No rigidity.  Skin:    General: Skin is dry.     Capillary Refill: Capillary refill takes less than 2 seconds.     Comments: Rash on volar aspect of forearms bilaterally.  Neurological:     Mental Status: She is alert and oriented to person, place, and time.     GCS: GCS eye subscore is 4. GCS verbal subscore is 5. GCS motor subscore is 6.  Psychiatric:        Mood and Affect: Mood normal.        Behavior: Behavior normal.        Thought Content: Thought content normal.     ED Results / Procedures / Treatments   Labs (all labs ordered are listed, but only abnormal results are displayed) Labs Reviewed  SALICYLATE LEVEL - Abnormal; Notable for the following components:      Result Value   Salicylate Lvl <7.0 (*)    All other components within normal limits  ACETAMINOPHEN LEVEL -  Abnormal; Notable for the following components:   Acetaminophen (Tylenol), Serum <10 (*)    All other components within normal limits  CBC - Abnormal; Notable for the following components:   MCV 100.2 (*)    All other components within normal limits  SARS CORONAVIRUS 2 BY RT PCR (HOSPITAL ORDER, PERFORMED IN [facility])  COMPREHENSIVE METABOLIC PANEL  ETHANOL  RAPID URINE DRUG SCREEN, HOSP PERFORMED  PHOSPHORUS  I-STAT BETA HCG BLOOD, ED ([facility], [facility], [facility] ONLY)  POC URINE PREG, ED    EKG None  Radiology No results found.  Procedures Procedures (including critical care time)  Medications Ordered in ED Medications - No data to display  ED Course  I have reviewed the triage vital signs and the nursing notes.  Pertinent labs & imaging results that were available during my care of the patient were reviewed by me and considered in my medical decision making (see chart for details).  Clinical Course as of Nov 01 2026  Tue Nov 01, 2019  2027 Spoke with Dr. Rachael Darby who will see and admit patient for her anorexia.    [GG]    Clinical Course User Index [GG] Lorelee New, PA-C   MDM Rules/Calculators/A&P                      Patient has a BMI of 14.31 kg/m.  She is extremely underweight and at risk for medical complications.  Given that she has failed outpatient therapy, reasonable that her primary care provider would like her to be admitted for inpatient stabilization.  Her VSS and WNL, which is reassuring.  Initial laboratory work-up is also reassuring.  Will obtain serum phosphorus.  Plan is for TTS consult given her anorexia and low body weight.  She denies any SI, HI, or AVH.  Maxwell Caul NP is recommending: 1. Start at 1200 kcal daily, advance by 200 kcal daily to goal per dietitian (high refeeding risk)  2. Electrolyte monitoring BID x 72 hours, then daily x 3 days, then QOD. 3. Goal for discharge is 7 days of refeeding, including 5 FULL days of nutrition (not  missing meals or supplements)  4. Avoid IV fluids. Use NGT when necessary  5. Continue outpatient psychiatric medications  6. Continue discussions with residential treatment centers   Spoke with Dr. Rachael Darby who will see and admit patient for her anorexia.    Final Clinical Impression(s) / ED Diagnoses Final diagnoses:  Anorexia    Rx / DC Orders ED Discharge Orders    None       Lorelee New, PA-C 11/01/19 2028    Melene Plan, DO 11/01/19 2051

## 2019-11-02 DIAGNOSIS — R630 Anorexia: Secondary | ICD-10-CM

## 2019-11-02 DIAGNOSIS — E43 Unspecified severe protein-calorie malnutrition: Secondary | ICD-10-CM | POA: Insufficient documentation

## 2019-11-02 DIAGNOSIS — F5000 Anorexia nervosa, unspecified: Secondary | ICD-10-CM

## 2019-11-02 LAB — VITAMIN D 25 HYDROXY (VIT D DEFICIENCY, FRACTURES): Vit D, 25-Hydroxy: 43.4 ng/mL (ref 30–100)

## 2019-11-02 LAB — CBC
HCT: 45 % (ref 36.0–46.0)
Hemoglobin: 14.4 g/dL (ref 12.0–15.0)
MCH: 30.8 pg (ref 26.0–34.0)
MCHC: 32 g/dL (ref 30.0–36.0)
MCV: 96.2 fL (ref 80.0–100.0)
Platelets: 262 10*3/uL (ref 150–400)
RBC: 4.68 MIL/uL (ref 3.87–5.11)
RDW: 13.5 % (ref 11.5–15.5)
WBC: 4.8 10*3/uL (ref 4.0–10.5)
nRBC: 0 % (ref 0.0–0.2)

## 2019-11-02 LAB — URINE CYTOLOGY ANCILLARY ONLY
Chlamydia: NEGATIVE
Comment: NEGATIVE
Comment: NORMAL
Neisseria Gonorrhea: NEGATIVE

## 2019-11-02 LAB — BASIC METABOLIC PANEL
Anion gap: 10 (ref 5–15)
BUN: 14 mg/dL (ref 6–20)
CO2: 27 mmol/L (ref 22–32)
Calcium: 9.3 mg/dL (ref 8.9–10.3)
Chloride: 103 mmol/L (ref 98–111)
Creatinine, Ser: 0.65 mg/dL (ref 0.44–1.00)
GFR calc Af Amer: >60 mL/min (ref >60)
GFR calc non Af Amer: >60 mL/min (ref >60)
Glucose, Bld: 93 mg/dL (ref 70–99)
Potassium: 4.2 mmol/L (ref 3.5–5.1)
Sodium: 140 mmol/L (ref 135–145)

## 2019-11-02 LAB — URINALYSIS, ROUTINE W REFLEX MICROSCOPIC
Bilirubin Urine: NEGATIVE
Glucose, UA: NEGATIVE mg/dL
Hgb urine dipstick: NEGATIVE
Ketones, ur: NEGATIVE mg/dL
Leukocytes,Ua: NEGATIVE
Nitrite: NEGATIVE
Protein, ur: NEGATIVE mg/dL
Specific Gravity, Urine: 1.009 (ref 1.005–1.030)
pH: 6 (ref 5.0–8.0)

## 2019-11-02 LAB — TSH: TSH: 1.767 u[IU]/mL (ref 0.350–4.500)

## 2019-11-02 LAB — COMPREHENSIVE METABOLIC PANEL
ALT: 21 U/L (ref 0–44)
AST: 25 U/L (ref 15–41)
Albumin: 4.9 g/dL (ref 3.5–5.0)
Alkaline Phosphatase: 58 U/L (ref 38–126)
Anion gap: 9 (ref 5–15)
BUN: 8 mg/dL (ref 6–20)
CO2: 28 mmol/L (ref 22–32)
Calcium: 9.8 mg/dL (ref 8.9–10.3)
Chloride: 104 mmol/L (ref 98–111)
Creatinine, Ser: 0.64 mg/dL (ref 0.44–1.00)
GFR calc Af Amer: >60 mL/min (ref >60)
GFR calc non Af Amer: >60 mL/min (ref >60)
Glucose, Bld: 91 mg/dL (ref 70–99)
Potassium: 3.6 mmol/L (ref 3.5–5.1)
Sodium: 141 mmol/L (ref 135–145)
Total Bilirubin: 0.9 mg/dL (ref 0.3–1.2)
Total Protein: 7.3 g/dL (ref 6.5–8.1)

## 2019-11-02 LAB — GAMMA GT: GGT: 12 U/L (ref 7–50)

## 2019-11-02 LAB — AMYLASE: Amylase: 97 U/L (ref 28–100)

## 2019-11-02 LAB — LIPASE, BLOOD: Lipase: 28 U/L (ref 11–51)

## 2019-11-02 LAB — HIV ANTIBODY (ROUTINE TESTING W REFLEX): HIV 1&2 Ab, 4th Generation: NONREACTIVE

## 2019-11-02 LAB — MAGNESIUM
Magnesium: 2.1 mg/dL (ref 1.7–2.4)
Magnesium: 2.1 mg/dL (ref 1.7–2.4)

## 2019-11-02 LAB — LIPID PANEL
Cholesterol: 250 mg/dL — ABNORMAL HIGH (ref 0–200)
HDL: 72 mg/dL (ref >40)
LDL Cholesterol: 161 mg/dL — ABNORMAL HIGH (ref 0–99)
Total CHOL/HDL Ratio: 3.5 RATIO
Triglycerides: 87 mg/dL (ref <150)
VLDL: 17 mg/dL (ref 0–40)

## 2019-11-02 LAB — T4, FREE: Free T4: 0.63 ng/dL (ref 0.61–1.12)

## 2019-11-02 LAB — PHOSPHORUS
Phosphorus: 4.1 mg/dL (ref 2.5–4.6)
Phosphorus: 4.2 mg/dL (ref 2.5–4.6)

## 2019-11-02 LAB — SEDIMENTATION RATE: Sed Rate: 8 mm/hr (ref 0–22)

## 2019-11-02 LAB — URIC ACID: Uric Acid, Serum: 3.4 mg/dL (ref 2.5–7.1)

## 2019-11-02 MED ORDER — THIAMINE HCL 100 MG PO TABS
100.0000 mg | ORAL_TABLET | Freq: Every day | ORAL | Status: DC
  Administered 2019-11-02 – 2019-11-04 (×3): 100 mg via ORAL
  Filled 2019-11-02 (×3): qty 1

## 2019-11-02 MED ORDER — CALCIUM CARBONATE ANTACID 500 MG PO CHEW: 1.0000 | CHEWABLE_TABLET | Freq: Three times a day (TID) | ORAL | Status: DC | PRN

## 2019-11-02 MED ORDER — CALCIUM CARBONATE ANTACID 500 MG PO CHEW
1.0000 | CHEWABLE_TABLET | Freq: Three times a day (TID) | ORAL | Status: DC
  Administered 2019-11-02: 200 mg via ORAL
  Filled 2019-11-02: qty 1

## 2019-11-02 NOTE — Social Work (Addendum)
CSW acknowledging consult "eating disorder pt, meets inpatient criteria for disordered eating." Per notes pt IVC should have been rescinded by medical team. At this time CSW has reached out to [facility] leadership as placements for disordered eating can be difficult to find and CSW also has also contacted pediatric CSW Elizabeth.   Marcelino Duster agrees that pt (as she is an adult) will need to consent to further treatment for placement and will reach out to Alfonso Ramus, NP who is managing pt outpatient treatment plans.  CSW was able to speak in person with Alfonso Ramus, NP and at this time pt is not agreeable to inpatient treatment placement, if she does become agreeable outpatient team states they will make those referrals. TOC team let [place] Citizen know that I remain available to support pt with my team as needed. CSW has also relayed this to [facility] leadership supervisor [place] and RNCM Elton Sin, MSW, LCSW [facility] Clinical Social Work

## 2019-11-02 NOTE — Progress Notes (Signed)
Family Medicine Teaching Service Daily Progress Note Intern Pager: [phone]  Patient name: Gail Anderson Medical record number: [id number] Date of birth: 1995-10-15 Age: 24 y.o. Gender: female  Primary Care Provider: Patient, No Pcp Per Consultants: Dietary, psychiatry, psychology, adolescent medicine Code Status: Full code  Pt Overview and Major Events to Date:  11/01/2019-patient admitted for anorexia nervosa  Assessment and Plan: Anarie Kalish is a 24 y.o. female presented via GPD with IVC paperwork for dangerously low body weight. PMH is significant for anorexia nervosa with severe malnutrition,  GAD, MDD.  Anorexia nervosa Patient diagnosed with eating disorder in 2015 and follows with dietitian.  Patient follows restrictive vegan diet.  CMP this morning is within normal limits, lipid profile shows cholesterol of 250, LDL-161, triglycerides-87. -Adolescent medicine Jonathon Resides, NP) following, appreciate recommendations -Registered dietitian consulted -Consult Dr. Hulen Skains in child psychology -Social work consult -Refeeding labs ordered, daily UA, mag, Phos, BMP -Morning EKGs -Weekly labs -Multivitamin with minerals daily -Melatonin 5 mg nightly as needed -Olanzapine 5 mg daily -Hydroxyzine 10 mg 3 times daily for meal anxiety -One-to-one sitter -Vitals per routine -Morning orthostatics -Strict I's and O's -Start at 1200 kcal daily and advance by 200 kcals daily to a goal per dietitians recommendations -Avoid IV fluids -NG tube if necessary -Please follow eating disorder protocol  Rash Worsening over the last 2 weeks, torso and axilla.  Concern for vitamin deficiency or mineral deficiency related rashes.  Patient reports that the rash is similar to the rash that she gets from wearing fake jewelry but it is in more places.  It has been present for approximately 2 weeks and itched until last night. -Kenalog 0.5 mg twice daily -Atarax for itching -Desitin cream daily -Follow-up  on zinc level ordered outpatient -Daily multivitamin with minerals -Consider biopsy if no improvement  GAD and MDD Her medications include sertraline 150 mg daily, olanzapine 5 mg daily, Atarax 10 mg 3 times daily -Continue home meds  FEN/GI: Per dietary PPx: Lovenox  Disposition: Pending consultations with Dr. Hulen Skains in child psychiatry as well as further work-up for refeeding syndrome  Subjective:  Patient reports she is not doing well today because she had a very poor experience in the emergency department.  Her family is present during the evaluation.  Has questions regarding the plan for the day.  Reports that her rash is not itching anymore.  Objective: Temp:  [97.7 F (36.5 C)-98.7 F (37.1 C)] 98.7 F (37.1 C) (06/02 0551) Pulse Rate:  [61-98] 98 (06/02 0551) Resp:  [12-18] 14 (06/02 0548) BP: (90-113)/(49-84) 94/49 (06/02 0551) SpO2:  [98 %-100 %] 100 % (06/02 0551) Weight:  [38.4 kg-39 kg] 38.4 kg (06/02 0457) Physical Exam: General: Extremely underweight female, is crying with her parents when we enter the room Cardiovascular: Regular rate and rhythm, no murmurs appreciated Respiratory: Lungs are clear to auscultation bilaterally with normal work of breathing Abdomen: Soft, nontender, positive bowel sounds Extremities: Thin extremities, no edema present  Laboratory: Recent Labs  Lab 10/27/19 1441 11/01/19 1536  WBC 4.6 6.1  HGB 14.0 13.6  HCT 43.0 44.7  PLT 258 251   Recent Labs  Lab 10/27/19 1441 11/01/19 1136 11/01/19 1536  NA 141 140 140  K 4.6 4.9 4.1  CL 102 101 102  CO2 '30 29 26  ' BUN '10 11 9  ' CREATININE 0.67 0.69 0.65  CALCIUM 9.7 10.4* 9.6  PROT 7.4  --  7.3  BILITOT 0.6  --  0.7  ALKPHOS  --   --  56  ALT 17  --  21  AST 24  --  28  GLUCOSE 87 93 79   Lipid profile -Cholesterol 215 -HDL 72 -LDL 161 -Triglycerides 87  Vitamin D-43.4 ESR-8 TSH-1.767 T4-0.63 UDS-negative  Imaging/Diagnostic Tests: Twelve-lead EKG-sinus  bradycardia Gifford Shave, MD 11/02/2019, 6:26 AM PGY-1, [place] Intern pager: [phone], text pages welcome

## 2019-11-02 NOTE — Progress Notes (Signed)
Initial Nutrition Assessment  DOCUMENTATION CODES:   Severe malnutrition in context of chronic illness, Underweight  INTERVENTION:    RD to order all meals.    Provide Ensure Enlive po if meal completion inadequate, each supplement provides 350 kcal and 20 grams of protein.    Continue multivitamin once daily.    Continue 100 mg thiamine once daily for at least 3 days.    Monitor magnesium, potassium, and phosphorus daily for at least 5 days, MD to replete as needed, as pt is at risk for refeeding syndrome given severe malnutrition and restrictive eating.   Pt to meet full nutrition goal on Monday, 6/7.  NUTRITION DIAGNOSIS:   Severe Malnutrition related to chronic illness(anorexia nervosa) as evidenced by percent weight loss, severe fat depletion, severe muscle depletion.  GOAL:   Patient will meet greater than or equal to 90% of their needs  MONITOR:   PO intake, Supplement acceptance, Skin, Weight trends, Labs, I & O's  REASON FOR ASSESSMENT:   Consult Assessment of nutrition requirement/status  ASSESSMENT:   24 y.o. female presented via GPD with IVC paperwork for dangerously low body weight. PMH is significant for anorexia nervosa with severe malnutrition,  GAD, MDD   Usual diet intake per pt report: Breakfast: fruit, oatmeal with nut butter or cereal with soy or almond milk Lunch: sandwich, salad, and fruit Dinner: grain or rice bowl with tofu, fruit Snack TID between meals: granola and fruit bar and/or cheese string Fluids: water, coffee, Ensure shake once daily at night Total calorie intake: ~1900 kcal/day  Meal completion at breakfast and lunch has been 100%. Pt reports no abdominal discomfort during time of visit. Per weight records, pt with a 10% weight loss over the past 3 month, which is significant for time frame. Family at bedside. Pt and family with no questions regarding nutrition at this time.   Spoke with Gail Resides, NP regarding care of  patient. Pt followed virtually by outpatient dietitian based out of [place], [place]. NP reports pt's intake has been gradually decreasing and over the weekend reports pt with very little to no po intake. Pt with severe malnutrition and high risk for refeeding syndrome. Initially would start pt off with 1600 kcal, however as pt with severe malnutrition and very high refeed risk, nutrition plan has been modified to start at 1200 kcal and increase by 200 calories daily until nutrition goal is met. Pt to meet full nutrition goal on Monday, 6/7. If meal completion incomplete at meals, recommend supplementing with Ensure to provide adequate nutrition. RD to continue with MVI and thiamine. Goal weight gain of 100-200 gram gain/day.   NUTRITION - FOCUSED PHYSICAL EXAM:    Most Recent Value  Orbital Region  Moderate depletion  Upper Arm Region  Moderate depletion  Thoracic and Lumbar Region  Severe depletion  Buccal Region  Moderate depletion  Temple Region  Severe depletion  Clavicle Bone Region  Severe depletion  Clavicle and Acromion Bone Region  Severe depletion  Scapular Bone Region  Unable to assess  Dorsal Hand  Severe depletion  Patellar Region  Severe depletion  Anterior Thigh Region  Severe depletion  Posterior Calf Region  Moderate depletion  Edema (RD Assessment)  None  Hair  Reviewed  Eyes  Reviewed  Mouth  Reviewed  Skin  Reviewed  Nails  Reviewed     Labs and medications reviewed.   Diet Order:   Diet Order  Diet regular Room service appropriate? Yes; Fluid consistency: Thin  Diet effective now              EDUCATION NEEDS:   Not appropriate for education at this time  Skin:  Skin Assessment: Reviewed RN Assessment  Last BM:  5/30  Height:   Ht Readings from Last 1 Encounters:  11/01/19 _0  (1.651 m)    Weight:   Wt Readings from Last 1 Encounters:  11/02/19 38.4 kg    Ideal Body Weight:  56.8 kg  BMI:  Body mass index is 14.09  kg/m.  Estimated Nutritional Needs:   Kcal:  2100-2300  Protein:  100-110 grams  Fluid:  >/= 2 L/day    Gail Parker, MS, RD, LDN RD pager number/after hours weekend pager number on Amion.

## 2019-11-02 NOTE — Progress Notes (Addendum)
Nutrition Brief Note  RD to order all meals.   List of food items RD has ordered at meals. RN and staff may modify food tray to match the list below if meal tray inaccurate upon arrival.   Lunch to arrive at 1200: 1 Soymilk 1 fresh fruit cup 1 serving carrots 1 serving corn Balsamic grilled chicken  Dinner to arrive at 6:00pm: 1 serving roasted [place] 1 sweet potato 1 serving broccoli  Snack to arrive at 8:00 pm: 1 soymilk 1 fresh fruit cup  Thursday 6/3: Breakfast to arrive at 8:00am: 1 serving oatmeal 1 peanut butter  Snack to arrive at 10:30am: 1 soymilk 1 apple  Noted, pt reports abdominal discomfort and feelings of fullness with lunch today and requested to lessen volume of food presented at meals by ordering part of meal to be presented as snacks.  Clydene Pugh MS, RD, LDN RD pager number/after hours weekend pager number on Amion.

## 2019-11-02 NOTE — Consult Note (Addendum)
Adolescent Medicine Consultation Gail Anderson  is a 24 y.o. female admitted for severe malnutrition, anorexia with dangerously low bodyweight.      PCP Confirmed?  Needs adult PCP  Patient, No Pcp Per   History was provided by the patient, mother and father.  Chart review:  Admitted late last PM. Did eat 100% of breakfast this AM.    Last STI screen: Yesterday Pertinent Labs: Lytes, EKG and urine stable   HPI:  Pt reports it was a long day yesterday getting admitted. She really didn't have anything to eat from 2 pm till this morning. She didn't get much sleep overnight. When asked directly if I can send her information out to continue process with [facility] or somewhere similar for inpatient/residential, she declined and said she just wants to do what she needs to do to get out of here and continue outpatient. Denies any issues overnight.  Parents give collateral that she has been sick since 18 and agree that she has never been weight restored. They have tried to be supportive, but Gail Anderson has kept them largely out of her recovery which has been hard. They agree she needs a higher level of care after hospital stay and are agreeable to helping her get there and then finish college after. Her brother has also been here and supportive.    Social History: Lives alone with cat in [place], [place]. Mom, dad and older brother.   Physical Exam:  Vitals:   11/02/19 0457 11/02/19 0548 11/02/19 0550 11/02/19 0551  BP:  96/60 93/62 (!) 94/49  Pulse:  61 85 98  Resp:  14    Temp:  98.7 F (37.1 C) 98.6 F (37 C) 98.7 F (37.1 C)  TempSrc:      SpO2:  100% 100% 100%  Weight: 38.4 kg     Height:       BP (!) 94/49 (BP Location: Right Arm)   Pulse 98   Temp 98.7 F (37.1 C)   Resp 14   Ht 5\' 5"  (1.651 m)   Wt 38.4 kg   SpO2 100%   BMI 14.09 kg/m  Body mass index: body mass index is 14.09 kg/m. Growth percentile SmartLinks can only be used for patients less than 96 years old.  Physical Exam   Constitutional: Vital signs are normal. She appears malnourished. She appears cachectic.  HENT:  Severe temporal wasting  Cardiovascular: Normal rate.  Neurological: She is alert.  Skin: Skin is warm. Rash noted.  Psychiatric: Her mood appears anxious. She exhibits a depressed mood. She has a flat affect.     Assessment/Plan: 24 yo assigned female at birth, identifies as female, admitted yesterday under IVC for severe anorexia and malnutrition. She was admitted voluntarily, so IVC was rescinded yesterday in the ED. Family medicine service following at this time, appreciate their assistance.   Continue eating disorder protocol. Peds service is unable to accept pt at this time.   Dr. Audria Nine and Corrin Parker, RD will see pt this morning. Discussed plan of care for hospital stay.   If patient gives consent, I will fax information to residential treatment facilities of her choice.   Please begin cardiac monitoring for the next 72+ hours during refeeding pending labs and overall vitals.   Disposition Plan: Recommend residential treatment facility, likely [facility] for appropriate level of care  Medical decision-making:  > 60 minutes spent, more than 50% of appointment was spent discussing diagnosis and management of  symptoms

## 2019-11-02 NOTE — Plan of Care (Signed)
  Problem: Education: Goal: Knowledge of General Education information will improve Description Including pain rating scale, medication(s)/side effects and non-pharmacologic comfort measures Outcome: Progressing   

## 2019-11-03 LAB — BASIC METABOLIC PANEL
Anion gap: 10 (ref 5–15)
Anion gap: 8 (ref 5–15)
BUN: 14 mg/dL (ref 6–20)
BUN: 14 mg/dL (ref 6–20)
CO2: 28 mmol/L (ref 22–32)
CO2: 30 mmol/L (ref 22–32)
Calcium: 9.4 mg/dL (ref 8.9–10.3)
Calcium: 9.3 mg/dL (ref 8.9–10.3)
Chloride: 100 mmol/L (ref 98–111)
Chloride: 103 mmol/L (ref 98–111)
Creatinine, Ser: 0.7 mg/dL (ref 0.44–1.00)
Creatinine, Ser: 0.61 mg/dL (ref 0.44–1.00)
GFR calc Af Amer: >60 mL/min (ref >60)
GFR calc Af Amer: >60 mL/min (ref >60)
GFR calc non Af Amer: >60 mL/min (ref >60)
GFR calc non Af Amer: >60 mL/min (ref >60)
Glucose, Bld: 85 mg/dL (ref 70–99)
Glucose, Bld: 106 mg/dL — ABNORMAL HIGH (ref 70–99)
Potassium: 3.9 mmol/L (ref 3.5–5.1)
Potassium: 4.1 mmol/L (ref 3.5–5.1)
Sodium: 138 mmol/L (ref 135–145)
Sodium: 141 mmol/L (ref 135–145)

## 2019-11-03 LAB — URINALYSIS, ROUTINE W REFLEX MICROSCOPIC
Bilirubin Urine: NEGATIVE
Glucose, UA: NEGATIVE mg/dL
Hgb urine dipstick: NEGATIVE
Ketones, ur: NEGATIVE mg/dL
Leukocytes,Ua: NEGATIVE
Nitrite: NEGATIVE
Protein, ur: NEGATIVE mg/dL
Specific Gravity, Urine: 1.009 (ref 1.005–1.030)
pH: 6 (ref 5.0–8.0)

## 2019-11-03 LAB — PROLACTIN: Prolactin: 12.4 ng/mL (ref 4.8–23.3)

## 2019-11-03 LAB — FOLLICLE STIMULATING HORMONE: FSH: 3.8 m[IU]/mL

## 2019-11-03 LAB — PHOSPHORUS
Phosphorus: 5.1 mg/dL — ABNORMAL HIGH (ref 2.5–4.6)
Phosphorus: 4.2 mg/dL (ref 2.5–4.6)

## 2019-11-03 LAB — MAGNESIUM
Magnesium: 2 mg/dL (ref 1.7–2.4)
Magnesium: 2 mg/dL (ref 1.7–2.4)

## 2019-11-03 LAB — LUTEINIZING HORMONE: LH: 0.3 m[IU]/mL

## 2019-11-03 LAB — ESTRADIOL: Estradiol: <5 pg/mL

## 2019-11-03 MED ORDER — POLYETHYLENE GLYCOL 3350 17 G PO PACK
17.0000 g | PACK | Freq: Every day | ORAL | Status: DC
  Administered 2019-11-03: 17 g via ORAL
  Filled 2019-11-03 (×2): qty 1

## 2019-11-03 MED ORDER — SENNA 8.6 MG PO TABS
1.0000 | ORAL_TABLET | Freq: Every day | ORAL | Status: DC
  Administered 2019-11-03: 8.6 mg via ORAL
  Filled 2019-11-03 (×2): qty 1

## 2019-11-03 NOTE — Progress Notes (Signed)
Nutrition Follow-Up Note  DOCUMENTATION CODES:   Severe malnutrition in context of chronic illness, Underweight  INTERVENTION:    RD to order all meals.    Recommend providing Ensure Enlive po if meal completion inadequate, each supplement provides 350 kcal and 20 grams of protein.    Continue multivitamin once daily.    Continue 100 mg thiamine once daily for at least 3 days.    Monitor magnesium, potassium, and phosphorus daily, MD to replete as needed, as pt is at risk for refeeding syndrome given severe malnutrition and restrictive eating.   Pt to meet full nutrition goal on Monday, 6/7.  NUTRITION DIAGNOSIS:   Severe Malnutrition related to chronic illness(anorexia nervosa) as evidenced by percent weight loss, severe fat depletion, severe muscle depletion; ongoing  GOAL:   Patient will meet greater than or equal to 90% of their needs; progressing  MONITOR:   PO intake, Supplement acceptance, Skin, Weight trends, Labs, I & O's  REASON FOR ASSESSMENT:   Consult Assessment of nutrition requirement/status  ASSESSMENT:   24 y.o. female presented via [employer] with IVC paperwork for dangerously low body weight. PMH is significant for anorexia nervosa with severe malnutrition,  GAD, MDD   Meal completion has been 75-100%. Pt received ~1200 kcal yesterday per nutrition plan. Plans to increase by 200 kcal to meet 1400 kcal meal plan today. Will continue to increase by 200 kcal each day until full nutrition goal is met. Pt to meet full nutrition goal on Monday, 6/7. Pt reports slight abdominal discomfort and fullness. Pt requests desire to lessen volume of food presented at meals by ordering part of meal to be presented as snacks in between. Pt cooperative during meal ordering with RD. Parents at bedside during time of visit. No questions related to nutrition asked at this time.   Labs and medications reviewed. Potassium and magnesium WNL. Phosphorous elevated at 5.1.  Diet  Order:   Diet Order            Diet regular Room service appropriate? Yes; Fluid consistency: Thin  Diet effective now              EDUCATION NEEDS:   Not appropriate for education at this time  Skin:  Skin Assessment: Reviewed RN Assessment  Last BM:  5/30  Height:   Ht Readings from Last 1 Encounters:  11/01/19 '5\' 5"'  (1.651 m)    Weight:   Wt Readings from Last 1 Encounters:  11/03/19 40 kg    Ideal Body Weight:  56.8 kg  BMI:  Body mass index is 14.67 kg/m.  Estimated Nutritional Needs:   Kcal:  2100-2300  Protein:  100-110 grams  Fluid:  >/= 2 L/day   Corrin Parker, MS, RD, LDN RD pager number/after hours weekend pager number on Amion.

## 2019-11-03 NOTE — Progress Notes (Signed)
Family Medicine Teaching Service Daily Progress Note Intern Pager: [phone]  Patient name: Gail Anderson Medical record number: [id number] Date of birth: March 28, 1996 Age: 24 y.o. Gender: female  Primary Care Provider: Patient, No Pcp Per Consultants: Dietary, psychiatry, psychology, adolescent medicine Code Status: Full code  Pt Overview and Major Events to Date:  11/01/2019-patient admitted for anorexia nervosa  Assessment and Plan: Gail Anderson is a 24 y.o. female presented via [employer] with IVC paperwork for dangerously low body weight. PMH is significant for anorexia nervosa with severe malnutrition,  GAD, MDD.  Anorexia nervosa Patient diagnosed with eating disorder in 2015 and follows with dietitian.  Patient follows restrictive vegan diet.  CMP this morning remains within normal limits although phosphorus is mildly elevated at 5.1 no signs of refeeding at this time.  EKG showed normal sinus rhythm. -Adolescent medicine Jonathon Resides, NP) following, appreciate recommendations -Registered dietitian consulted -Consult Dr. Hulen Skains in child psychology -Social work consult -Refeeding labs ordered, daily UA, mag, Phos, BMP -Morning EKGs -Weekly labs -Multivitamin with minerals daily -Melatonin 5 mg nightly as needed -Olanzapine 5 mg daily -Hydroxyzine 10 mg 3 times daily for meal anxiety -One-to-one sitter -Vitals per routine -Morning orthostatics -Strict I's and O's -Start at 1200 kcal daily and advance by 200 kcals daily to a goal per dietitians recommendations -Avoid IV fluids -NG tube if necessary -Please follow eating disorder protocol  Rash Worsening over the last 2 weeks, torso and axilla.  Concern for vitamin deficiency or mineral deficiency related rashes.  Patient reports that the rash is similar to the rash that she gets from wearing fake jewelry but it is in more places.  It has been present for approximately 2 weeks and itched until the night prior to discharge.  Reports it  is improved since yesterday.  Appears to look slightly better than yesterday per my exam -Kenalog 0.5 mg twice daily -Atarax for itching -Desitin cream daily -Follow-up on zinc level ordered outpatient -Daily multivitamin with minerals -Consider biopsy if no improvement  GAD and MDD Her medications include sertraline 150 mg daily, olanzapine 5 mg daily, Atarax 10 mg 3 times daily -Continue home meds  FEN/GI: Per dietary PPx: Lovenox  Disposition: Pending consultations with Dr. Hulen Skains in child psychiatry as well as further work-up for refeeding syndrome  Subjective:  Evaluation completed with Dr. Owens Shark, my attending in the room.  Patient feels like she did well overnight and has no current concerns.  She reports that she would like to go home so that she can spend her birthday at home rather than in the hospital.  We discussed that her lab work has looked well since arrival and that we will talk with the adolescent medicine team regarding next steps in management.  Objective: Temp:  [97.8 F (36.6 C)-98.5 F (36.9 C)] 97.8 F (36.6 C) (06/02 2300) Pulse Rate:  [68-78] 70 (06/02 2300) Resp:  [16-18] 16 (06/02 2300) BP: (88-90)/(56-65) 88/57 (06/02 2300) SpO2:  [93 %-98 %] 93 % (06/02 2300) Physical Exam: General: Extremely underweight female but in no acute distress Cardiovascular: Regular heart rate and rhythm, no murmurs noted Respiratory: Normal work of breathing, lungs clear to auscultation bilaterally Abdomen: Soft, nontender, positive bowel sounds Extremities: No edema noted Skin: Patient has erythematous scaly rash on right upper arm as well as behind right knee.  Patient reports it is on the back of both knees.  Laboratory: Recent Labs  Lab 10/27/19 1441 11/01/19 1536 11/02/19 0822  WBC 4.6 6.1 4.8  HGB 14.0  13.6 14.4  HCT 43.0 44.7 45.0  PLT 258 251 262   Recent Labs  Lab 10/27/19 1441 11/01/19 1136 11/01/19 1536 11/01/19 1536 11/02/19 0822 11/02/19 1642  11/03/19 0223  NA 141   < > 140   < > 141 140 138  K 4.6   < > 4.1   < > 3.6 4.2 3.9  CL 102   < > 102   < > 104 103 100  CO2 30   < > 26   < > _0 BUN 10   < > 9   < > _1 CREATININE 0.67   < > 0.65   < > 0.64 0.65 0.70  CALCIUM 9.7   < > 9.6   < > 9.8 9.3 9.4  PROT 7.4  --  7.3  --  7.3  --   --   BILITOT 0.6  --  0.7  --  0.9  --   --   ALKPHOS  --   --  56  --  58  --   --   ALT 17  --  21  --  21  --   --   AST 24  --  28  --  25  --   --   GLUCOSE 87   < > 79   < > 91 93 85   < > = values in this interval not displayed.   Lipid profile -Cholesterol 215 -HDL 72 -LDL 161 -Triglycerides 87  Vitamin D-43.4 ESR-8 TSH-1.767  T4-0.63 UDS-negative  Imaging/Diagnostic Tests: Twelve-lead EKG-sinus bradycardia Gifford Shave, MD 11/03/2019, 5:52 AM PGY-1, [place] Intern pager: [phone], text pages welcome

## 2019-11-03 NOTE — Progress Notes (Signed)
PROGRESS NOTE:  Spoke with patient Gail Anderson, Recruitment consultant was in the room. I explained to her that unfortunately she will not be able to transfer to [facility] this evening and that she will most likely be transferred tomorrow. She voiced understanding of this and seemed comfortable with this decision.   Safety sitter was in the room, order remains for safety sitter, which we would greatly appreciate through the night.   Peggyann Shoals, DO [facility], PGY-2 11/03/2019 7:02 PM

## 2019-11-03 NOTE — Progress Notes (Addendum)
Nutrition Brief Note  RD to order all meals.   List of food items RD has ordered at meals. RN and staff may modify food tray to match the list below if meal tray inaccurate upon arrival.   Lunch to arrive at 1240: 1 peanut butter and jelly sandwich on wheat bread 1 serving carrots  Snack to arrive at 3:00pm: 1 vanilla yogurt 1 fresh fruit cup  Dinner to arrive at 6:00pm: Hummus with pita and veggies 1 garden side salad 1 packet of italian dressing  Snack to arrive at 8:00pm: 1 soy milk 1 serving grapes  Friday 6/4: Breakfast to arrive at 8:00am: 2 slices of whole wheat toast 1 peanut butter 1 serving scrambled eggs  Snack to arrive at 10:30am: 1 soy milk 1 fresh fruit cup  Clydene Pugh MS, RD, LDN RD pager number/after hours weekend pager number on Amion.

## 2019-11-03 NOTE — Consult Note (Signed)
Consult Note  Gail Anderson is an 24 y.o. female. MRN: [id number] DOB: 1995/11/04  Referring Physician: Terisa Starr, MD  Reason for Consult: Active Problems:   Anorexia nervosa with dangerously low body weight   Protein-calorie malnutrition, severe   Evaluation: [name] is a 24 yr old female with a history of disordered eating (severe restriction) who was admitted because she is at higher risk for refeeding syndrome. She is a bright, "strong-willed, stubborn, independentChild psychotherapist in Engineer, maintenance (IT) at [employer].  She has taken several leaves from [facility] to attend treatment for her eating disorder. It appears that her out-patient team of dietitian and eating disorder therapist felt she was not making adequate progress and required a higher level of intervention for her eating disorder. [name] is somewhat blase about her disordered eating. At this point she has no friend group at [facility] and she broke up with a long-term boyfriend relationship. 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 She basically lives alone with her cat. She said she did get lonely at times but felt she had adapted to it.  She responded that she was doing "good" when I asked her and estimated that she had eaten 98% of her meals. She noted that she had stomach "fullness" and that it did hurt some. She expressed some anxiety prior to meals because she knew she "had to eat" and the food is not great tasting.   Both [name] and her parents described what they felt was less than coordinated care. I reiterated that Honey's "numbers" were good when she was admitted and continue to be good. Her admission focuses on her increased risk of refeeding syndrome. I also talked with [name] about the possibility of a shower, a walk, going off the unit, and perhaps needing some miralax as she has not stooled since the weekend.   When I asked [name] if she was going to be  able to make it through this hospitalization, she stated that she basically had no choice. When asked to explain this, she stated that her parents had made it clear that she needs to stay and they expect her to stay. She also told me yesterday that she saw this admission as a potential "wake up call"     Impression/ Plan: [name] is a 24 yr old admitted for the medical management of disordered eating including increased risk of refeeding syndrome.   I have spoken to [facility] Dietitian, Dr. Manson Passey, Family Medicine Attending, and Leward Quan NP with the Adolescent Clinic. This is a very difficult diagnosis and treatment which requires incredible coordination  and is best managed in a "programmatic" manner. [name]'s parents are support of this treatment and [name] has stated that she will stay here.   Diagnosis: disordered eating  Time spent with patient: 25 minutes  Nelva Bush, PhD  11/03/2019 11:40 AM

## 2019-11-03 NOTE — Plan of Care (Signed)

## 2019-11-03 NOTE — Discharge Summary (Signed)
Family Medicine Teaching [facility] Discharge Summary  Patient name: Gail Anderson Medical record number: [id number] Date of birth: Nov 19, 1995 Age: 24 y.o. 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  Patient reports it is on the back of both knees.  Brief Hospital Course:  Gail Anderson is a 24 yo female with past medical history significant for anorexia nervosa with severe malnutrition, GAD, MDD who presented for admission due to dangerously low body weight.  Below is a description by problem.  Severe malnutrition due to anorexia nervosa She was seen by PCP on 11/01/2018 Who noted she was failing outpatient management of severe anorexia nervosa and recommended she be admitted for management.  She was initially IVC by her adolescent medicine provider Alfonso Ramus and escorted to [facility] ED by [facility].  Once arrived the patient agreed to admission and the IVC was rescinded.  Given her age she  required admission to the family practice teaching service other than the pediatric service but with close contact with the pediatric service for management of this patient given their strict inpatient protocol for eating disorders in the management of refeeding.  Initially patient was ordered for daily EKGs and twice daily BMPs, Phos, mag.  Through discussions with the pediatric team and not family medicine team it was determined that the patient would benefit greatly from an inpatient facility or tailored to treat eating disorders.  Transfer order has been placed for transfer to [facility]. Refeeding labs remained within normal limits throughout hospitalization.   Rash On admission patient noted a rash that she reported was similar to the one that she gets with "fake jewelry" but this 1 is "more".  She reports it has been present for approximately 2 weeks and that it is itchy.  She was initially treated with Desitin cream daily and Kenalog topical 0.5% twice daily.  On the second day of admission patient reported that the itching had decreased and she was making improvement.    Issues for Follow Up:  1. Continue to seek medical stabilization inpatient, and then follow-up outpatient once stable. 2. Follow-up on rash improvement  Significant Procedures: None  Significant Labs and Imaging:  Recent Labs  Lab 11/01/19 1536 11/02/19 0822  WBC 6.1 4.8  HGB 13.6 14.4  HCT 44.7 45.0  PLT 251 262   Recent Labs  Lab 11/01/19 1536 11/01/19 1830 11/02/19 0822 11/02/19 0822 11/02/19 1642 11/02/19 1642 11/03/19 0223 11/03/19 0223 11/03/19 1857 11/04/19 0313  NA 140   < > 141  --  140  --  138  --  141 140  K 4.1   < > 3.6   < > 4.2   < > 3.9   < >  4.1 4.3  CL 102   < > 104  --  103  --  100  --  103 102  CO2 26   < > 28  --  27  --  28  --  30 29  GLUCOSE 79   < > 91  --  93  --  85  --  106* 79  BUN 9   < > 8  --  14  --  14  --  14 12  CREATININE 0.65   < > 0.64  --  0.65  --  0.70  --  0.61  0.59  CALCIUM 9.6   < > 9.8  --  9.3  --  9.4  --  9.3 9.3  MG  --   --  2.1  --  2.1  --  2.0  --  2.0 2.1  PHOS  --    < > 4.1  --  4.2  --  5.1*  --  4.2 4.8*  ALKPHOS 56  --  58  --   --   --   --   --   --   --   AST 28  --  25  --   --   --   --   --   --   --   ALT 21  --  21  --   --   --   --   --   --   --   ALBUMIN 5.0  --  4.9  --   --   --   --   --   --   --    < > = values in this interval not displayed.   Magnesium: 2 Phosphorus: 5.1   Results/Tests Pending at Time of Discharge: None  Discharge Medications:  Allergies as of 11/04/2019      Reactions   Avocado Nausea And Vomiting   Penicillins Rash      Medication List    TAKE these medications   calcium gluconate 500 MG tablet Take 1 tablet by mouth daily.   fexofenadine 180 MG tablet Commonly known as: ALLEGRA Take 180 mg by mouth daily.   hydrOXYzine 10 MG tablet Commonly known as: ATARAX/VISTARIL Take 1 tablet (10 mg total) by mouth 3 (three) times daily as needed. What changed: reasons to take this   lactobacillus acidophilus Tabs tablet Take 2 tablets by mouth in the morning.   multivitamin tablet Take 1 tablet by mouth daily.   OLANZapine 5 MG tablet Commonly known as: ZyPREXA Take 1/2 tablet for 3 days. After 3 days, increase to 1 whole tablet What changed:   how much to take  how to take this  when to take this  additional instructions   Oyster Shell Calcium 500 MG Tabs TAKE 2 TABLETS (1,000 MG TOTAL) BY MOUTH DAILY.   sertraline 100 MG tablet Commonly known as: ZOLOFT Take 1.5 tablets (150 mg total) by mouth daily.   triamcinolone ointment 0.5 % Commonly known as: KENALOG Apply 1 application topically 2 (two) times daily.   Vitamin D 50 MCG (2000 UT) Caps Take 1 capsule (2,000 Units total) by mouth daily.       Discharge Instructions: Please refer to Patient Instructions section of EMR for full details.  Patient was counseled important signs and symptoms that should  prompt return to medical care, changes in medications, dietary instructions, activity restrictions, and follow up appointments.   Follow-Up Appointments: -Patient  is being transferred to inpatient facility but will need follow-up outpatient. -Additionally, patient is 24 years old, and should seek a new primary care physician from adult medicine.   Derrel Nip, MD 11/04/2019, 1:45 PM PGY-1, [facility]

## 2019-11-03 NOTE — Consult Note (Signed)
Adolescent Medicine Consultation Gail Anderson  is a 24 y.o. female admitted for severe malnutrition, anorexia with dangerously low bodyweight.      PCP Confirmed?  Needs adult PCP  Patient, No Pcp Per   History was provided by the patient, mother and father.  Chart review:  Ate 75-100% of meals in the last 24 hours. Was not offered ensure per protocol when she did not finish dinner.     Last STI screen: Yesterday Pertinent Labs: Lytes, EKG and urine stable   HPI:  Has had some weight gain overnight and been stable. She would like to shower and be able to get up and move around more today. Has seen psychologist and parents have spoken with them as well.   She has not had a bowel movement in about 5 days- requests miralax or similar to aid with this as she feels quite full.   Parents and I discussed disposition- they fully support her financially and thus will be making the decision for her to go to further care at this point. I had discussed case with Dr. Henrene Pastor who is in agreement that she would be best served at [facility] at this time so she can benefit from a full refeeding protocol and hopefully be transferred laterally into the eating disorders unit once she is determined to be stable. I discussed the likely need of her parents to get temporary guardianship of her. Dr. Henrene Pastor advised Gail Anderson legal aid team may be able to help assist further with questions about this.   Social History: Lives alone with cat in [place], [place]. Mom, dad and older brother.   Physical Exam:  Vitals:   11/02/19 2300 11/03/19 0500 11/03/19 0627 11/03/19 1402  BP: (!) 88/57  (!) 90/55 (!) 87/55  Pulse: 70  69 65  Resp: 16  16 16   Temp: 97.8 F (36.6 C)  98 F (36.7 C) 98.3 F (36.8 C)  TempSrc: Axillary  Oral Oral  SpO2: 93%  98% 96%  Weight:  40 kg    Height:       BP (!) 87/55 (BP Location: Right Arm)   Pulse 65   Temp 98.3 F (36.8 C) (Oral)   Resp 16   Ht 5\' 5"  (1.651 m)   Wt 40 kg   SpO2 96%   BMI  14.67 kg/m  Body mass index: body mass index is 14.67 kg/m. Growth percentile SmartLinks can only be used for patients less than 45 years old.  Physical Exam  Constitutional: Vital signs are normal. She appears malnourished. She appears cachectic.  HENT:  Severe temporal wasting  Cardiovascular: Normal rate.  Neurological: She is alert.  Skin: Skin is warm. Rash noted.  Psychiatric: Her mood appears anxious. She exhibits a depressed mood. She has a flat affect.     Assessment/Plan: 24 yo assigned female at birth, identifies as female, admitted yesterday under IVC for severe anorexia and malnutrition. Family medicine service following at this time, appreciate their assistance.   Please give miralax + senna for bowel movement.   Given difficulty with completing full eating disorder protocol on the adult service and pediatrics inability to accept her here at [facility], we will transfer her to [facility] to their pediatric service line. I have discussed with Dr. Henrene Pastor and she will be going to the general pediatrics service. PAL line contacted and awaiting bed placement. Appreciate family med assistance with transfer. Patient notified and although unhappy, agreeable.    Disposition Plan: To [facility] when  transport can be arranged.   Medical decision-making:  > 60 minutes spent, more than 50% of appointment was spent discussing diagnosis and management of symptoms

## 2019-11-04 DIAGNOSIS — R630 Anorexia: Secondary | ICD-10-CM

## 2019-11-04 DIAGNOSIS — F322 Major depressive disorder, single episode, severe without psychotic features: Secondary | ICD-10-CM

## 2019-11-04 LAB — URINALYSIS, ROUTINE W REFLEX MICROSCOPIC
Bilirubin Urine: NEGATIVE
Glucose, UA: NEGATIVE mg/dL
Hgb urine dipstick: NEGATIVE
Ketones, ur: NEGATIVE mg/dL
Leukocytes,Ua: NEGATIVE
Nitrite: NEGATIVE
Protein, ur: NEGATIVE mg/dL
Specific Gravity, Urine: 1.01 (ref 1.005–1.030)
pH: 6 (ref 5.0–8.0)

## 2019-11-04 LAB — BASIC METABOLIC PANEL
Anion gap: 9 (ref 5–15)
BUN: 11 mg/dL (ref 7–25)
BUN: 12 mg/dL (ref 6–20)
CO2: 29 mmol/L (ref 20–32)
CO2: 29 mmol/L (ref 22–32)
Calcium: 10.4 mg/dL — ABNORMAL HIGH (ref 8.6–10.2)
Calcium: 9.3 mg/dL (ref 8.9–10.3)
Chloride: 101 mmol/L (ref 98–110)
Chloride: 102 mmol/L (ref 98–111)
Creat: 0.69 mg/dL (ref 0.50–1.10)
Creatinine, Ser: 0.59 mg/dL (ref 0.44–1.00)
GFR calc Af Amer: >60 mL/min (ref >60)
GFR calc non Af Amer: >60 mL/min (ref >60)
Glucose, Bld: 93 mg/dL (ref 65–99)
Glucose, Bld: 79 mg/dL (ref 70–99)
Potassium: 4.9 mmol/L (ref 3.5–5.3)
Potassium: 4.3 mmol/L (ref 3.5–5.1)
Sodium: 140 mmol/L (ref 135–146)
Sodium: 140 mmol/L (ref 135–145)

## 2019-11-04 LAB — MAGNESIUM
Magnesium: 2.3 mg/dL (ref 1.5–2.5)
Magnesium: 2.1 mg/dL (ref 1.7–2.4)

## 2019-11-04 LAB — ZINC

## 2019-11-04 LAB — PHOSPHORUS
Phosphorus: 3.8 mg/dL (ref 2.5–4.5)
Phosphorus: 4.8 mg/dL — ABNORMAL HIGH (ref 2.5–4.6)

## 2019-11-04 MED ORDER — SERTRALINE HCL 100 MG PO TABS: 150.0000 mg | ORAL_TABLET | Freq: Every day | ORAL | Status: AC

## 2019-11-04 MED ORDER — THERA-M PO TABS
1.00 | ORAL_TABLET | ORAL | Status: DC
Start: 2019-11-05 — End: 2019-11-04

## 2019-11-04 MED ORDER — SENNOSIDES 8.6 MG PO TABS
1.00 | ORAL_TABLET | ORAL | Status: DC
Start: 2019-11-06 — End: 2019-11-04

## 2019-11-04 MED ORDER — ZINC OXIDE 40 % EX PSTE
PASTE | CUTANEOUS | Status: DC
Start: ? — End: 2019-11-04

## 2019-11-04 MED ORDER — CALCIUM CARBONATE 1250 (500 CA) MG PO CHEW
CHEWABLE_TABLET | ORAL | Status: DC
Start: ? — End: 2019-11-04

## 2019-11-04 MED ORDER — TRIAMCINOLONE ACETONIDE 0.1 % EX OINT
1.00 | TOPICAL_OINTMENT | CUTANEOUS | Status: DC
Start: ? — End: 2019-11-04

## 2019-11-04 MED ORDER — LORATADINE 10 MG PO TABS
10.00 | ORAL_TABLET | ORAL | Status: DC
Start: 2019-11-05 — End: 2019-11-04

## 2019-11-04 MED ORDER — ENOXAPARIN SODIUM 300 MG/3ML IJ SOLN
20.00 | INTRAMUSCULAR | Status: DC
Start: 2019-11-05 — End: 2019-11-04

## 2019-11-04 MED ORDER — POLYETHYLENE GLYCOL 3350 17 GM/SCOOP PO POWD
17.00 | ORAL | Status: DC
Start: 2019-11-05 — End: 2019-11-04

## 2019-11-04 MED ORDER — MELATONIN 3 MG PO TABS
5.00 | ORAL_TABLET | ORAL | Status: DC
Start: ? — End: 2019-11-04

## 2019-11-04 MED ORDER — GENERIC EXTERNAL MEDICATION
150.00 | Status: DC
Start: 2019-11-05 — End: 2019-11-04

## 2019-11-04 NOTE — Progress Notes (Signed)
Patient's BP taken and went 76/46 while standing up for 3 minutes for orthostatic vitals order which made patient on [place]. This RN rechecked full vitals, patient at rest, sitting, denies pain or dizziness. Vitals on green mews now, will continue to monitor.

## 2019-11-04 NOTE — Progress Notes (Addendum)
Nutrition Follow-Up Note  DOCUMENTATION CODES:   Severe malnutrition in context of chronic illness, Underweight  INTERVENTION:    RD to order all meals.    Recommend providing Ensure Enlive po if meal completion inadequate, each supplement provides 350 kcal and 20 grams of protein.    Continue multivitamin once daily.    Continue 100 mg thiamine once daily for at least 3 days.    Monitor magnesium, potassium, and phosphorus daily, MD to replete as needed, as pt is at risk for refeeding syndrome given severe malnutrition and restrictive eating.   Pt to meet full nutrition goal on Monday, 6/7.  NUTRITION DIAGNOSIS:   Severe Malnutrition related to chronic illness(anorexia nervosa) as evidenced by percent weight loss, severe fat depletion, severe muscle depletion; ongoing  GOAL:   Patient will meet greater than or equal to 90% of their needs; progressing  MONITOR:   PO intake, Supplement acceptance, Skin, Weight trends, Labs, I & O's  REASON FOR ASSESSMENT:   Consult Assessment of nutrition requirement/status  ASSESSMENT:   24 y.o. female presented via [employer] with IVC paperwork for dangerously low body weight. PMH is significant for anorexia nervosa with severe malnutrition,  GAD, MDD   Pt with a 400 gram weight gain since admission. Meal completion has been 75-100%. Pt received ~1400 kcal yesterday per nutrition plan. Plans to increase by 200 kcal to meet 1600 kcal meal plan today. Will continue to increase by 200 kcal each day until full nutrition goal is met. Pt to meet full nutrition goal on Monday, 6/7. Pt continues to report slight abdominal discomfort and fullness. Pt able to have bowel movement yesterday, however reports small amount. Pt requests desire to lessen volume of food presented at meals by ordering part of meal to be presented as snacks in between. Pt cooperative during meal ordering with RD. No questions related to nutrition asked at this time. Parents  attentive at bedside. Plans for pt to transfer to [facility] general pediatric service when bed available for more enhanced monitoring of medical stabilization for disordered eating.   Labs and medications reviewed. Potassium and magnesium WNL. Phosphorous elevated at 4.8.  Diet Order:   Diet Order            Diet regular Room service appropriate? Yes; Fluid consistency: Thin  Diet effective now              EDUCATION NEEDS:   Not appropriate for education at this time  Skin:  Skin Assessment: Reviewed RN Assessment  Last BM:  6/3  Height:   Ht Readings from Last 1 Encounters:  11/01/19 '5\' 5"'  (1.651 m)    Weight:   Wt Readings from Last 1 Encounters:  11/04/19 38.8 kg    Ideal Body Weight:  56.8 kg  BMI:  Body mass index is 14.23 kg/m.  Estimated Nutritional Needs:   Kcal:  2100-2300  Protein:  100-110 grams  Fluid:  >/= 2 L/day   Corrin Parker, MS, RD, LDN RD pager number/after hours weekend pager number on Amion.

## 2019-11-04 NOTE — Progress Notes (Signed)
Family Medicine Teaching Service Daily Progress Note Intern Pager: [phone]  Patient name: Gail Anderson Medical record number: [id number] Date of birth: [birth year] Age: 24 y.o. Gender: female  Primary Care Provider: Patient, No Pcp Per Consultants: Dietary, psychiatry, psychology, adolescent medicine Code Status: Full code  Pt Overview and Major Events to Date:  11/01/2019-patient admitted for anorexia nervosa  Assessment and Plan: Gail Anderson is a 24 y.o. female presented via [employer] with IVC paperwork for dangerously low body weight. PMH is significant for anorexia nervosa with severe malnutrition,  GAD, MDD.  Anorexia nervosa Patient diagnosed with eating disorder in 2015 and follows with dietitian.  Patient follows restrictive vegan diet.  CMP this morning remains within normal limits although phosphorus is mildly elevated at 4.8 no signs of refeeding at this time.  Overnight telemetry showed possible first-degree block with a PR interval of 210.  EKG showed normal sinus rhythm.  Patient reports she is doing well this morning.  Says that she is emotionally doing well and reports her anxiety is improved.  Does not wish for medications at this time.  Plan for transfer to [facility] Gail Resides, NP) following, appreciate recommendations -Registered dietitian consulted -Consult Dr. Hulen Anderson in child psychology -Social work consult -Refeeding labs ordered, daily UA, mag, Phos, BMP -Morning EKGs -Twice daily labs for 1 more day and then will transition -Multivitamin with minerals daily -Melatonin 5 mg nightly as needed -Olanzapine 5 mg daily -Hydroxyzine 10 mg 3 times daily for meal anxiety -One-to-one sitter -Vitals per routine -Morning orthostatics -Strict I's and O's -Start at 1200 kcal daily and advance by 200 kcals daily to a goal per dietitians recommendations -Avoid IV fluids -NG tube if necessary -Please follow eating disorder protocol  Rash Worsened over the  last 2 weeks, torso and axilla.  Concern for vitamin deficiency or mineral deficiency related rashes.  Patient reports that it is improved from yesterday and she feels like it is most likely because she is not scratching as much. -Kenalog 0.5 mg twice daily -Atarax for itching -Desitin cream daily -Follow-up on zinc level ordered outpatient -Daily multivitamin with minerals -Consider biopsy if no improvement  GAD and MDD Her medications include sertraline 150 mg daily, olanzapine 5 mg daily, Atarax 10 mg 3 times daily -Continue home meds  FEN/GI: Per dietary PPx: Lovenox  Disposition: Pending consultations with Dr. Hulen Anderson in child psychiatry as well as further work-up for refeeding syndrome  Subjective:  Patient reports she is doing well today.  Is okay with transfer to [facility] if possible.  Reports her mood is improved from admission.  When asked about anxiety said she feels she is doing well and does not think that she needs any medication for anxiety.  Objective: Temp:  [97.5 F (36.4 C)-98.3 F (36.8 C)] 97.7 F (36.5 C) (06/04 1118) Pulse Rate:  [55-111] 66 (06/04 1118) Resp:  [15-18] 15 (06/04 1118) BP: (76-97)/(46-61) 89/59 (06/04 1118) SpO2:  [96 %-100 %] 100 % (06/04 1118) Weight:  [38.8 kg] 38.8 kg (06/04 4259) Physical Exam: General: Extremely underweight female but in no acute distress Respiratory: Normal work of breathing Extremities: No edema noted Skin: Patient has erythematous scaly rash on right upper arm as well as behind right knee.  Small spot under left knee not noticed prior but patient reports it has been there and that none of these are itching anymore.  No excoriations noted  Please see attending attestation for complete physical  Laboratory: Recent Labs  Lab 11/01/19 1536 11/02/19  1572  WBC 6.1 4.8  HGB 13.6 14.4  HCT 44.7 45.0  PLT 251 262   Recent Labs  Lab 11/01/19 1536 11/01/19 1536 11/02/19 0822 11/02/19 1642 11/03/19 0223 11/03/19 1857  11/04/19 0313  NA 140   < > 141   < > 138 141 140  K 4.1   < > 3.6   < > 3.9 4.1 4.3  CL 102   < > 104   < > 100 103 102  CO2 26   < > 28   < > '28 30 29  ' BUN 9   < > 8   < > '14 14 12  ' CREATININE 0.65   < > 0.64   < > 0.70 0.61 0.59  CALCIUM 9.6   < > 9.8   < > 9.4 9.3 9.3  PROT 7.3  --  7.3  --   --   --   --   BILITOT 0.7  --  0.9  --   --   --   --   ALKPHOS 56  --  58  --   --   --   --   ALT 21  --  21  --   --   --   --   AST 28  --  25  --   --   --   --   GLUCOSE 79   < > 91   < > 85 106* 79   < > = values in this interval not displayed.   Lipid profile -Cholesterol 215 -HDL 72 -LDL 161 -Triglycerides 87  Vitamin D-43.4 ESR-8 TSH-1.767  T4-0.63 UDS-negative  Imaging/Diagnostic Tests: Twelve-lead EKG-sinus rhythm  Gail Shave, MD 11/04/2019, 11:57 AM PGY-1, [place] Intern pager: [phone], text pages welcome

## 2019-11-04 NOTE — Hospital Course (Addendum)
Gail Anderson is a 24 yo female with past medical history significant for anorexia nervosa with severe malnutrition, GAD, MDD who presented for admission due to dangerously low body weight.  Below is a description by problem.  Severe malnutrition due to anorexia nervosa She was seen by PCP on 11/01/2018 Who noted she was failing outpatient management of severe anorexia nervosa and recommended she be admitted for management.  She was initially IVC by her adolescent medicine provider Alfonso Ramus and escorted to [facility] ED by [employer].  Once arrived the patient agreed to admission and the IVC was rescinded.  Given her age she required admission to the family practice teaching service other than the pediatric service but with close contact with the pediatric service for management of this patient given their strict inpatient protocol for eating disorders in the management of refeeding.  Initially patient was ordered for daily EKGs and twice daily BMPs, Phos, mag.  Through discussions with the pediatric team and not family medicine team it was determined that the patient would benefit greatly from an inpatient facility or tailored to treat eating disorders.  Transfer order has been placed for transfer to [facility]. Refeeding labs remained within normal limits throughout hospitalization.   Rash On admission patient noted a rash that she reported was similar to the one that she gets with "fake jewelry" but this 1 is "more".  She reports it has been present for approximately 2 weeks and that it is itchy.  She was initially treated with Desitin cream daily and Kenalog topical 0.5% twice daily.  On the second day of admission patient reported that the itching had decreased and she was making improvement.

## 2019-11-04 NOTE — Progress Notes (Signed)
EKG Review:  Overnight cardiac strip was suggestive of 1st-degree heart block with PR , HR was ~58. Reviewed AM EKG was negative for 1st-degree heart block, PR interval was , HR 64 bpm.   Will continue to monitor  Peggyann Shoals, DO [facility], PGY-2 11/04/2019 11:30 AM

## 2019-11-04 NOTE — Progress Notes (Addendum)
Nutrition Note  RD to order all meals.   List of food items RD has ordered at meals. RN and staff may modify food tray to match the list below if meal tray inaccurate upon arrival.   Friday, 6/4: Lunch to arrive at 1:15 pm: Hummus with pita and veggies 1 garden side salad 1 packet of Balsamic Vinaigrette dressing  Snack to arrive at 3:15 pm: 1 soy milk 1 apple  Dinner to arrive at 6:15 pm: 1 serving carrots 1 sweet potato Balsamic grilled chicken  Snack to arrive at 8:00 pm: 1 banana pudding 1 fresh fruit cup  Saturday, 6/5: Breakfast to arrive at 8:00am: 1 oatmeal with cinnamon 2 servings peanut butter  Snack to arrive at 10:30am: 1 chocolate pudding 1 fresh fruit cup  Clydene Pugh MS, RD, LDN RD pager number/after hours weekend pager number on Amion.

## 2019-11-04 NOTE — Progress Notes (Signed)
Patient transferred to [facility] via [facility], Report called and given to RN to receive at the Children's Unit. Necessary papers printed for transport and AVS.

## 2019-11-04 NOTE — Progress Notes (Signed)
This RN called [facility] for update on bed placement for patient, they have said there is no available bed yet.

## 2019-11-04 NOTE — Progress Notes (Signed)
I spoke with Gail Anderson's mother this morning by telephone to update her regarding transfer. We are still waiting on [facility] to obtain a single case agreement with [employer] prior to transfer. I have asked for another update as of 9:30 am regarding timeline. Am awaiting reply. Appreciate family medicine's ongoing assistance. If patient is still admitted this afternoon, I will round on her at that time. If any questions from medical or nursing staff prior to then, please text or call [phone].   Alfonso Ramus, FNP

## 2019-11-05 MED ORDER — CALCIUM CARBONATE 1500 (600 CA) MG PO TABS
ORAL_TABLET | ORAL | Status: DC
Start: 2019-11-08 — End: 2019-11-05

## 2019-11-05 MED ORDER — VITAMIN D3 10 MCG (400 UNIT) PO TABS
20.00 | ORAL_TABLET | ORAL | Status: DC
Start: 2019-11-08 — End: 2019-11-05

## 2019-11-05 MED ORDER — GENERIC EXTERNAL MEDICATION
Status: DC
Start: 2019-11-06 — End: 2019-11-05

## 2019-11-07 MED ORDER — POTASSIUM & SODIUM PHOSPHATES 280-160-250 MG PO PACK
1.00 | PACK | ORAL | Status: DC
Start: 2019-11-08 — End: 2019-11-07

## 2019-11-07 MED ORDER — MAGNESIUM OXIDE 400 MG PO TABS
400.00 | ORAL_TABLET | ORAL | Status: DC
Start: 2019-11-09 — End: 2019-11-07

## 2019-11-07 MED ORDER — SIMETHICONE 80 MG PO CHEW
80.00 | CHEWABLE_TABLET | ORAL | Status: DC
Start: ? — End: 2019-11-07

## 2019-11-07 MED ORDER — CHOLECALCIFEROL 25 MCG (1000 UT) PO TABS
50.00 | ORAL_TABLET | ORAL | Status: DC
Start: 2019-11-09 — End: 2019-11-07

## 2019-11-07 MED ORDER — THIAMINE HCL 100 MG PO TABS
200.00 | ORAL_TABLET | ORAL | Status: DC
Start: 2019-11-08 — End: 2019-11-07

## 2019-11-08 LAB — VITAMIN B1: Vitamin B1 (Thiamine): 152.5 nmol/L (ref 66.5–200.0)

## 2019-11-08 MED ORDER — MELATONIN 3 MG PO TABS
4.50 | ORAL_TABLET | ORAL | Status: DC
Start: 2019-11-08 — End: 2019-11-08

## 2019-11-08 MED ORDER — DIPHENHYDRAMINE HCL 25 MG PO CAPS
25.00 | ORAL_CAPSULE | ORAL | Status: DC
Start: ? — End: 2019-11-08

## 2020-01-02 ENCOUNTER — Other Ambulatory Visit: Payer: Self-pay | Admitting: Pediatrics

## 2020-01-02 DIAGNOSIS — F322 Major depressive disorder, single episode, severe without psychotic features: Secondary | ICD-10-CM
